# Patient Record
Sex: Female | Born: 1955 | Race: White | Hispanic: No | Smoking: Former smoker
Health system: Southern US, Community
[De-identification: ages and names within clinical notes are randomized; demographics above are authoritative.]

## PROBLEM LIST (undated history)

## (undated) DIAGNOSIS — K859 Acute pancreatitis without necrosis or infection, unspecified: Secondary | ICD-10-CM

## (undated) DIAGNOSIS — J45909 Unspecified asthma, uncomplicated: Secondary | ICD-10-CM

## (undated) DIAGNOSIS — I10 Essential (primary) hypertension: Secondary | ICD-10-CM

## (undated) DIAGNOSIS — C801 Malignant (primary) neoplasm, unspecified: Secondary | ICD-10-CM

## (undated) DIAGNOSIS — J449 Chronic obstructive pulmonary disease, unspecified: Secondary | ICD-10-CM

## (undated) DIAGNOSIS — E119 Type 2 diabetes mellitus without complications: Secondary | ICD-10-CM

## (undated) HISTORY — PX: HERNIA REPAIR: SHX51

## (undated) HISTORY — PX: ABDOMINAL HYSTERECTOMY: SHX81

## (undated) HISTORY — PX: CHOLECYSTECTOMY: SHX55

## (undated) HISTORY — PX: APPENDECTOMY: SHX54

## (undated) HISTORY — PX: FOOT SURGERY: SHX648

## (undated) HISTORY — PX: LUNG LOBECTOMY: SHX167

## (undated) HISTORY — PX: TUBAL LIGATION: SHX77

---

## 1999-04-08 ENCOUNTER — Encounter: Payer: Self-pay | Admitting: Family Medicine

## 1999-04-08 ENCOUNTER — Ambulatory Visit (HOSPITAL_COMMUNITY): Admission: RE | Admit: 1999-04-08 | Discharge: 1999-04-08 | Payer: Self-pay | Admitting: Family Medicine

## 1999-04-14 ENCOUNTER — Ambulatory Visit (HOSPITAL_COMMUNITY): Admission: RE | Admit: 1999-04-14 | Discharge: 1999-04-14 | Payer: Self-pay | Admitting: Family Medicine

## 1999-04-14 ENCOUNTER — Encounter: Payer: Self-pay | Admitting: Family Medicine

## 2003-05-12 ENCOUNTER — Ambulatory Visit (HOSPITAL_BASED_OUTPATIENT_CLINIC_OR_DEPARTMENT_OTHER): Admission: RE | Admit: 2003-05-12 | Discharge: 2003-05-12 | Payer: Self-pay | Admitting: Family Medicine

## 2003-06-18 ENCOUNTER — Ambulatory Visit (HOSPITAL_BASED_OUTPATIENT_CLINIC_OR_DEPARTMENT_OTHER): Admission: RE | Admit: 2003-06-18 | Discharge: 2003-06-18 | Payer: Self-pay | Admitting: Family Medicine

## 2004-01-07 ENCOUNTER — Ambulatory Visit (HOSPITAL_COMMUNITY): Admission: RE | Admit: 2004-01-07 | Discharge: 2004-01-07 | Payer: Self-pay | Admitting: Family Medicine

## 2004-04-22 ENCOUNTER — Ambulatory Visit (HOSPITAL_COMMUNITY): Admission: RE | Admit: 2004-04-22 | Discharge: 2004-04-22 | Payer: Self-pay | Admitting: Family Medicine

## 2005-03-24 ENCOUNTER — Encounter: Admission: RE | Admit: 2005-03-24 | Discharge: 2005-03-24 | Payer: Self-pay | Admitting: Family Medicine

## 2005-11-15 ENCOUNTER — Other Ambulatory Visit: Admission: RE | Admit: 2005-11-15 | Discharge: 2005-11-15 | Payer: Self-pay | Admitting: Family Medicine

## 2006-12-10 ENCOUNTER — Ambulatory Visit (HOSPITAL_COMMUNITY): Admission: RE | Admit: 2006-12-10 | Discharge: 2006-12-10 | Payer: Self-pay | Admitting: Family Medicine

## 2008-09-09 ENCOUNTER — Encounter: Payer: Self-pay | Admitting: Gastroenterology

## 2009-03-23 ENCOUNTER — Ambulatory Visit: Payer: Self-pay | Admitting: Gastroenterology

## 2009-03-23 DIAGNOSIS — R1033 Periumbilical pain: Secondary | ICD-10-CM | POA: Insufficient documentation

## 2009-03-30 ENCOUNTER — Ambulatory Visit: Payer: Self-pay | Admitting: Gastroenterology

## 2009-03-30 ENCOUNTER — Encounter: Payer: Self-pay | Admitting: Gastroenterology

## 2009-04-02 ENCOUNTER — Encounter: Payer: Self-pay | Admitting: Gastroenterology

## 2009-08-09 IMAGING — CR DG LUMBAR SPINE COMPLETE 4+V
1 series · 5 of 5 positions shown · non-contrast
Comparison: NONE

CLINICAL DATA: Low back and left leg pain. 

LUMBAR SPINE SERIES

[Series 1: view not recorded · 0.17mm/px · 5 of 5 slices shown]
[im 1/5]
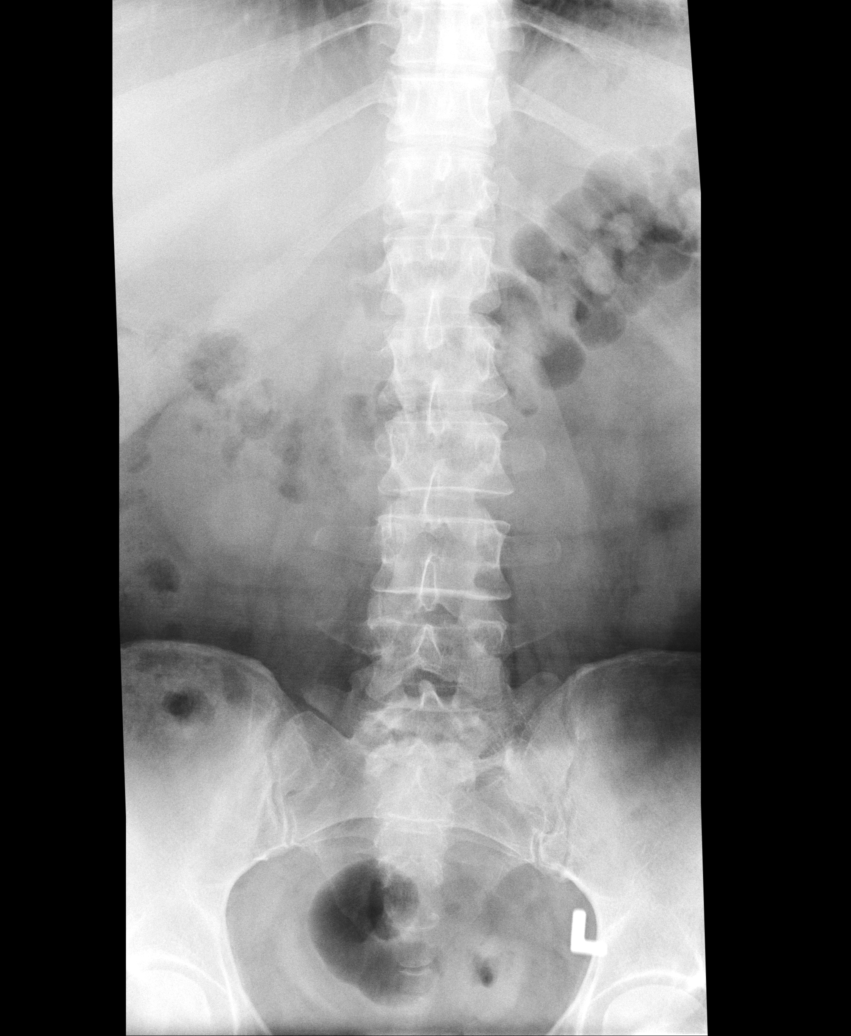
[im 2/5]
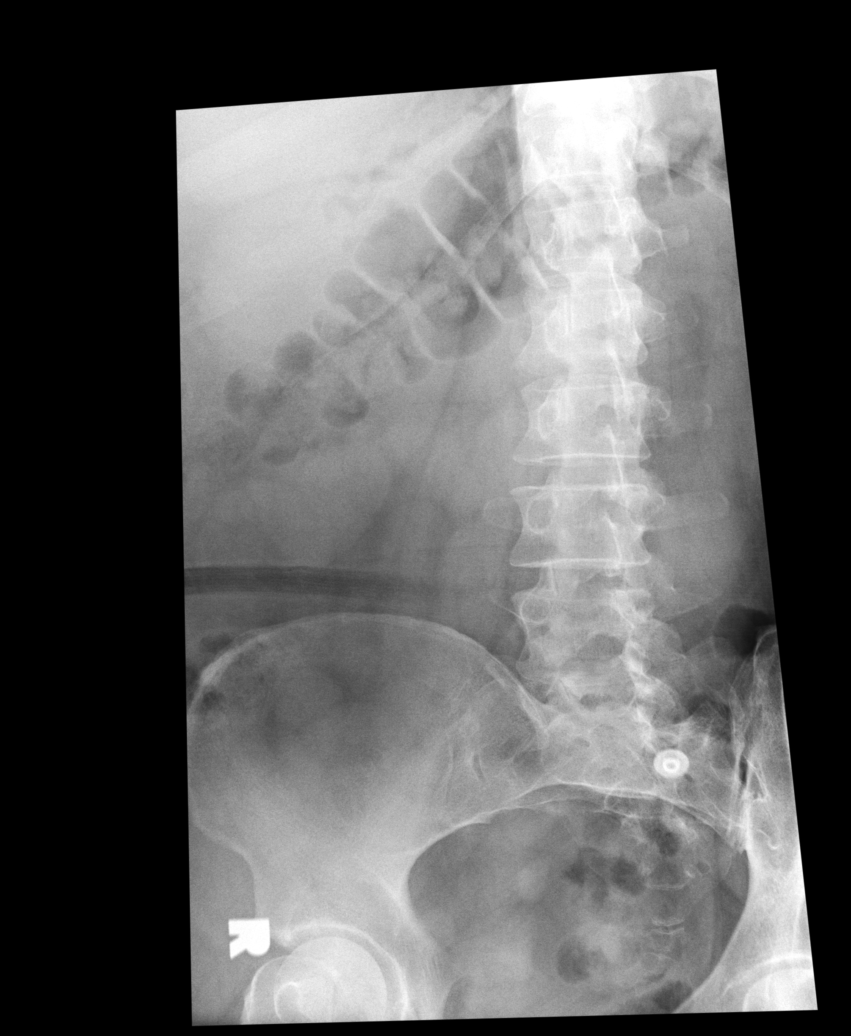
[im 3/5]
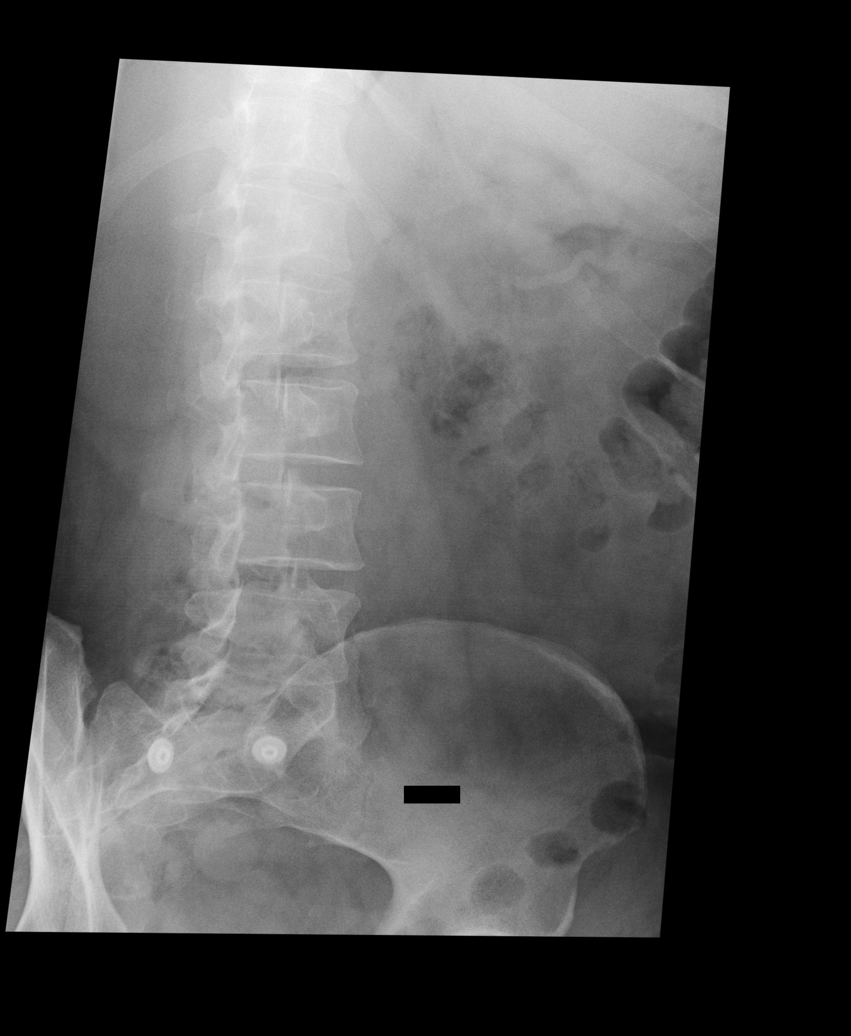
[im 4/5]
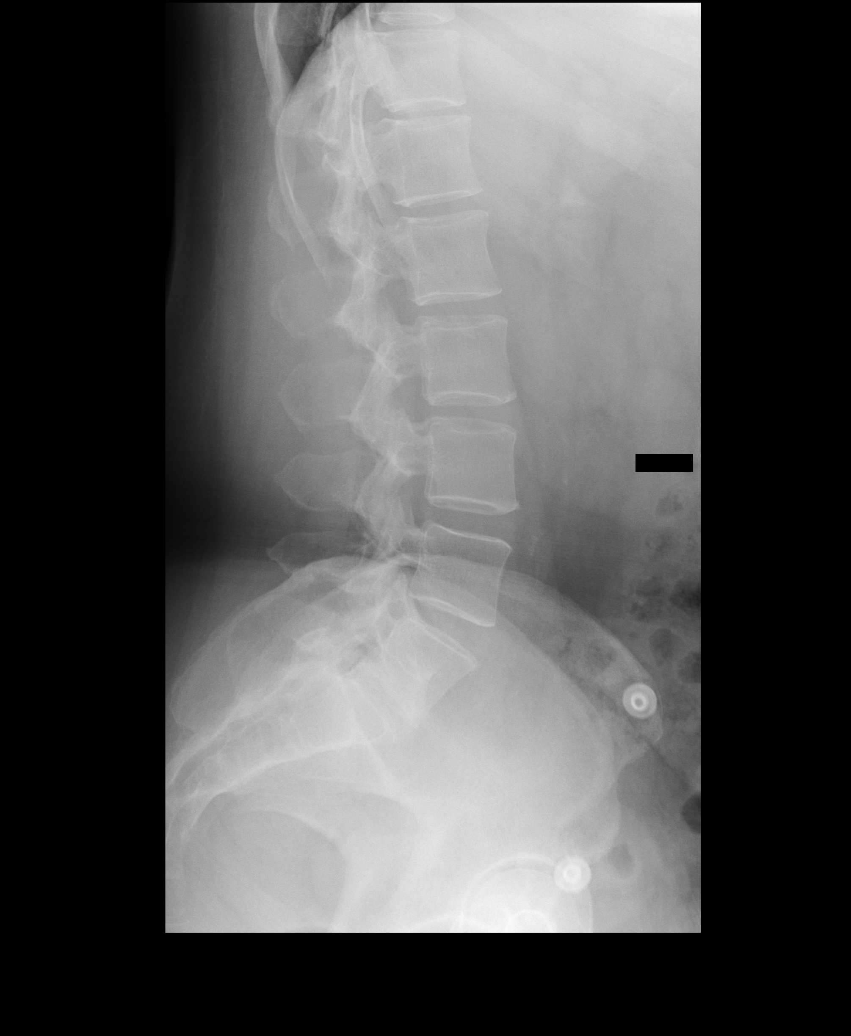
[im 5/5]
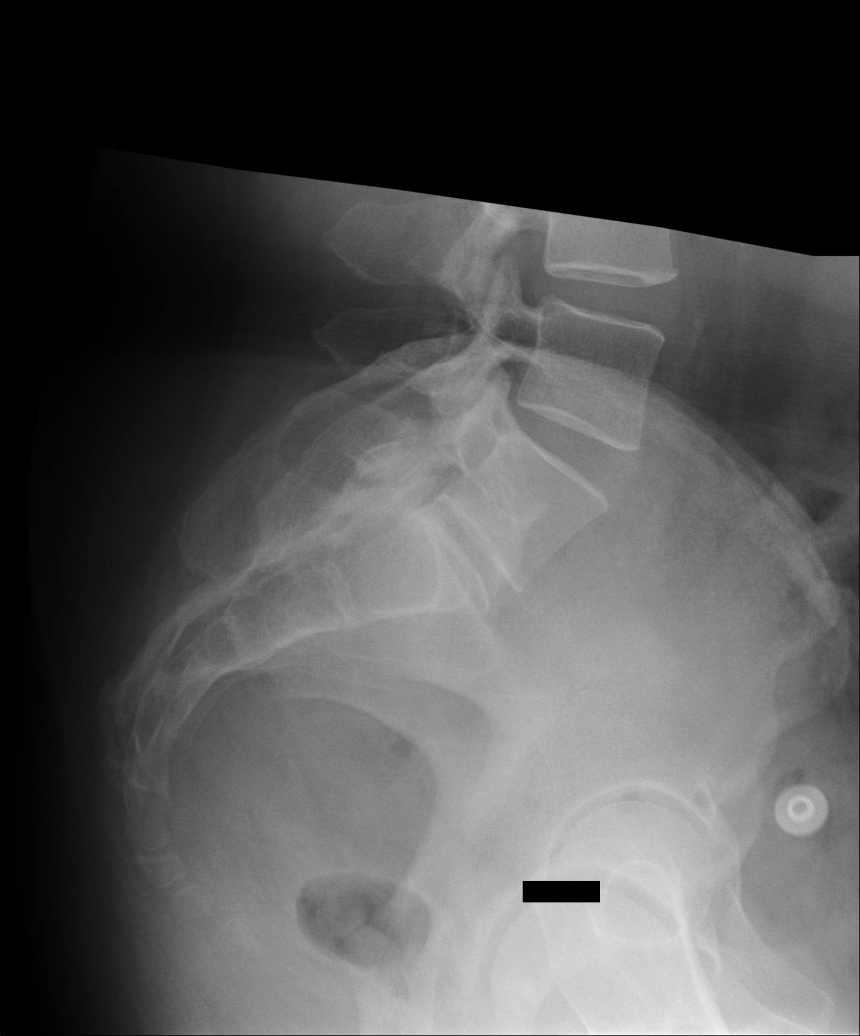

[5 of 5 positions shown; findings below may reference images not displayed]

FINDINGS: Routine views of the lumbar spine demonstrate vertebral 
bodies to be of normal stature, alignment, contour, and density.  
Disc spaces are well preserved.  Facet joints are normal.
IMPRESSION: Normal lumbar spine series. Note is made of a 
transitional S1 vertebral body with incomplete lumbarization. 
Date: 09/23/2008  Tran Date:  09/23/2008 DAS  [REDACTED]

## 2011-02-06 LAB — GLUCOSE, CAPILLARY: Glucose-Capillary: 100 mg/dL — ABNORMAL HIGH (ref 70–99)

## 2013-04-08 ENCOUNTER — Emergency Department (HOSPITAL_BASED_OUTPATIENT_CLINIC_OR_DEPARTMENT_OTHER): Payer: Self-pay

## 2013-04-08 ENCOUNTER — Emergency Department (HOSPITAL_BASED_OUTPATIENT_CLINIC_OR_DEPARTMENT_OTHER)
Admission: EM | Admit: 2013-04-08 | Discharge: 2013-04-09 | Disposition: A | Discharge status: Other Acute Inpatient Hospital | Payer: Self-pay | Attending: Emergency Medicine | Admitting: Emergency Medicine

## 2013-04-08 ENCOUNTER — Encounter (HOSPITAL_BASED_OUTPATIENT_CLINIC_OR_DEPARTMENT_OTHER): Payer: Self-pay | Admitting: *Deleted

## 2013-04-08 DIAGNOSIS — Z87891 Personal history of nicotine dependence: Secondary | ICD-10-CM | POA: Insufficient documentation

## 2013-04-08 DIAGNOSIS — R1110 Vomiting, unspecified: Secondary | ICD-10-CM | POA: Insufficient documentation

## 2013-04-08 DIAGNOSIS — E119 Type 2 diabetes mellitus without complications: Secondary | ICD-10-CM | POA: Insufficient documentation

## 2013-04-08 DIAGNOSIS — Z79899 Other long term (current) drug therapy: Secondary | ICD-10-CM | POA: Insufficient documentation

## 2013-04-08 DIAGNOSIS — R197 Diarrhea, unspecified: Secondary | ICD-10-CM | POA: Insufficient documentation

## 2013-04-08 DIAGNOSIS — I10 Essential (primary) hypertension: Secondary | ICD-10-CM | POA: Insufficient documentation

## 2013-04-08 DIAGNOSIS — K859 Acute pancreatitis without necrosis or infection, unspecified: Secondary | ICD-10-CM | POA: Insufficient documentation

## 2013-04-08 HISTORY — DX: Acute pancreatitis without necrosis or infection, unspecified: K85.90

## 2013-04-08 HISTORY — DX: Essential (primary) hypertension: I10

## 2013-04-08 HISTORY — DX: Type 2 diabetes mellitus without complications: E11.9

## 2013-04-08 LAB — URINALYSIS, ROUTINE W REFLEX MICROSCOPIC
Ketones, ur: 15 mg/dL — AB
Nitrite: NEGATIVE
Protein, ur: 30 mg/dL — AB
Urobilinogen, UA: 1 mg/dL (ref 0.0–1.0)

## 2013-04-08 LAB — COMPREHENSIVE METABOLIC PANEL
AST: 22 U/L (ref 0–37)
Albumin: 4.2 g/dL (ref 3.5–5.2)
Alkaline Phosphatase: 84 U/L (ref 39–117)
Calcium: 10.2 mg/dL (ref 8.4–10.5)
Chloride: 101 mEq/L (ref 96–112)
Creatinine, Ser: 0.9 mg/dL (ref 0.50–1.10)
GFR calc Af Amer: 81 mL/min — ABNORMAL LOW (ref >90)
GFR calc non Af Amer: 70 mL/min — ABNORMAL LOW (ref >90)
Total Protein: 8.1 g/dL (ref 6.0–8.3)

## 2013-04-08 LAB — CBC WITH DIFFERENTIAL/PLATELET
Basophils Absolute: 0 10*3/uL (ref 0.0–0.1)
Eosinophils Absolute: 0.3 10*3/uL (ref 0.0–0.7)
HCT: 42.8 % (ref 36.0–46.0)
Hemoglobin: 15.4 g/dL — ABNORMAL HIGH (ref 12.0–15.0)
Lymphs Abs: 2 10*3/uL (ref 0.7–4.0)
Platelets: 231 10*3/uL (ref 150–400)
WBC: 28.6 10*3/uL — ABNORMAL HIGH (ref 4.0–10.5)

## 2013-04-08 LAB — URINE MICROSCOPIC-ADD ON

## 2013-04-08 LAB — LIPASE, BLOOD: Lipase: 20 U/L (ref 11–59)

## 2013-04-08 MED ORDER — ONDANSETRON HCL 4 MG/2ML IJ SOLN
4.0000 mg | Freq: Once | INTRAMUSCULAR | Status: AC
  Administered 2013-04-08: 4 mg via INTRAVENOUS
  Filled 2013-04-08: qty 2

## 2013-04-08 MED ORDER — HYDROMORPHONE HCL PF 1 MG/ML IJ SOLN
1.0000 mg | Freq: Once | INTRAMUSCULAR | Status: AC
  Administered 2013-04-08: 1 mg via INTRAVENOUS
  Filled 2013-04-08: qty 1

## 2013-04-08 MED ORDER — SODIUM CHLORIDE 0.9 % IV BOLUS (SEPSIS)
1000.0000 mL | Freq: Once | INTRAVENOUS | Status: AC
  Administered 2013-04-08: 1000 mL via INTRAVENOUS

## 2013-04-08 MED ORDER — IOHEXOL 300 MG/ML  SOLN
100.0000 mL | Freq: Once | INTRAMUSCULAR | Status: AC | PRN
  Administered 2013-04-08: 100 mL via INTRAVENOUS

## 2013-04-08 MED ORDER — IOHEXOL 300 MG/ML  SOLN
50.0000 mL | Freq: Once | INTRAMUSCULAR | Status: AC | PRN
  Administered 2013-04-08: 50 mL via ORAL

## 2013-04-08 NOTE — ED Notes (Signed)
Patient transported to CT 

## 2013-04-08 NOTE — ED Notes (Signed)
Abdominal pain vomiting and diarrhea x 1 hour. States she has a hx of pancreatitis.

## 2013-04-08 NOTE — ED Provider Notes (Signed)
History     CSN: [id number]  Arrival date & time 04/08/13  2127   First MD Initiated Contact with Patient 04/08/13 2222      Chief Complaint  Patient presents with  . Abdominal Pain  . Emesis  . Diarrhea    (Consider location/radiation/quality/duration/timing/severity/associated sxs/prior treatment) Patient is a 57 y.o. female presenting with abdominal pain, vomiting, and diarrhea.  Abdominal Pain Associated symptoms include abdominal pain.  Emesis Associated symptoms: abdominal pain and diarrhea   Diarrhea Associated symptoms: abdominal pain and vomiting    Pt with history of pancreatitis with lengthy admission to [facility] several months ago reports she has not felt well since then, but over the last several days has been notably worse with diffuse severe abdominal pain, nausea vomiting and diarrhea this evening. No fever. Unable to tolerate PO fluids. Has not been back to GI for this exacerbation.  Past Medical History  Diagnosis Date  . Pancreatitis   . Diabetes mellitus without complication   . Hypertension     Past Surgical History  Procedure Laterality Date  . Abdominal hysterectomy    . Appendectomy    . Tubal ligation    . Foot surgery      No family history on file.  History  Substance Use Topics  . Smoking status: Former Games developer  . Smokeless tobacco: Not on file  . Alcohol Use: No    OB History   Grav Para Term Preterm Abortions TAB SAB Ect Mult Living                  Review of Systems  Gastrointestinal: Positive for vomiting, abdominal pain and diarrhea.   All other systems reviewed and are negative except as noted in HPI.   Allergies  Review of patient's allergies indicates no known allergies.  Home Medications   Current Outpatient Rx  Name  Route  Sig  Dispense  Refill  . LISINOPRIL PO   Oral   Take by mouth.         . METOPROLOL TARTRATE PO   Oral   Take by mouth.           BP 158/64  Pulse 61  Temp(Src) 97.8 F (36.6 C)  (Oral)  Resp 18  Ht 5\' 3"  (1.6 m)  Wt 190 lb (86.183 kg)  BMI 33.67 kg/m2  SpO2 97%  Physical Exam  Nursing note and vitals reviewed. Constitutional: She is oriented to person, place, and time. She appears well-developed and well-nourished. She appears distressed.  uncomfortable  HENT:  Head: Normocephalic and atraumatic.  Eyes: EOM are normal. Pupils are equal, round, and reactive to light.  Neck: Normal range of motion. Neck supple.  Cardiovascular: Normal rate, normal heart sounds and intact distal pulses.   Pulmonary/Chest: Effort normal and breath sounds normal.  Abdominal: Bowel sounds are normal. She exhibits no distension. There is tenderness (diffuse, worse in upper abd). There is no rebound and no guarding.  Musculoskeletal: Normal range of motion. She exhibits no edema and no tenderness.  Neurological: She is alert and oriented to person, place, and time. She has normal strength. No cranial nerve deficit or sensory deficit.  Skin: Skin is warm and dry. No rash noted.  Psychiatric: She has a normal mood and affect.    ED Course  Procedures (including critical care time)  Labs Reviewed  URINALYSIS, ROUTINE W REFLEX MICROSCOPIC - Abnormal; Notable for the following:    Color, Urine AMBER (*)    APPearance CLOUDY (*)  Specific Gravity, Urine 1.031 (*)    Bilirubin Urine SMALL (*)    Ketones, ur 15 (*)    Protein, ur 30 (*)    Leukocytes, UA SMALL (*)    All other components within normal limits  CBC WITH DIFFERENTIAL - Abnormal; Notable for the following:    WBC 28.6 (*)    Hemoglobin 15.4 (*)    Neutrophils Relative % 81 (*)    Lymphocytes Relative 7 (*)    Neutro Abs 23.2 (*)    Monocytes Absolute 3.1 (*)    All other components within normal limits  COMPREHENSIVE METABOLIC PANEL - Abnormal; Notable for the following:    Glucose, Bld 225 (*)    GFR calc non Af Amer 70 (*)    GFR calc Af Amer 81 (*)    All other components within normal limits  URINE  MICROSCOPIC-ADD ON - Abnormal; Notable for the following:    Squamous Epithelial / LPF MANY (*)    Bacteria, UA MANY (*)    Casts HYALINE CASTS (*)    All other components within normal limits  URINE CULTURE  LIPASE, BLOOD   No results found.   No diagnosis found.    MDM  Marked leukocytosis but normal LFT and Lipase. Pain improved with meds. Awaiting CT abd/pel. Care signed out to Dr. Nicanor Alcon on change of shift.         Rudell Marlowe B. Bernette Mayers, MD 04/08/13 [phone]

## 2013-04-09 MED ORDER — ONDANSETRON HCL 4 MG/2ML IJ SOLN
INTRAMUSCULAR | Status: AC
  Administered 2013-04-09: 01:00:00
  Filled 2013-04-09: qty 2

## 2013-04-09 MED ORDER — PIPERACILLIN-TAZOBACTAM 3.375 G IVPB 30 MIN
3.3750 g | Freq: Once | INTRAVENOUS | Status: AC
  Administered 2013-04-08: 3.375 g via INTRAVENOUS
  Filled 2013-04-09 (×2): qty 50

## 2013-04-09 MED ORDER — HYDROMORPHONE HCL PF 1 MG/ML IJ SOLN
INTRAMUSCULAR | Status: AC
  Administered 2013-04-09: 01:00:00
  Filled 2013-04-09: qty 1

## 2013-04-10 LAB — URINE CULTURE

## 2013-05-19 DIAGNOSIS — K219 Gastro-esophageal reflux disease without esophagitis: Secondary | ICD-10-CM | POA: Insufficient documentation

## 2013-05-19 DIAGNOSIS — E119 Type 2 diabetes mellitus without complications: Secondary | ICD-10-CM | POA: Insufficient documentation

## 2013-05-19 DIAGNOSIS — J449 Chronic obstructive pulmonary disease, unspecified: Secondary | ICD-10-CM | POA: Insufficient documentation

## 2013-05-19 DIAGNOSIS — E669 Obesity, unspecified: Secondary | ICD-10-CM | POA: Insufficient documentation

## 2013-05-19 DIAGNOSIS — I10 Essential (primary) hypertension: Secondary | ICD-10-CM | POA: Insufficient documentation

## 2013-05-19 DIAGNOSIS — J381 Polyp of vocal cord and larynx: Secondary | ICD-10-CM | POA: Insufficient documentation

## 2013-05-19 DIAGNOSIS — M545 Low back pain, unspecified: Secondary | ICD-10-CM | POA: Insufficient documentation

## 2014-02-28 ENCOUNTER — Encounter: Payer: Self-pay | Admitting: Gastroenterology

## 2014-03-13 ENCOUNTER — Encounter: Payer: Self-pay | Admitting: Gastroenterology

## 2014-10-28 ENCOUNTER — Emergency Department (HOSPITAL_BASED_OUTPATIENT_CLINIC_OR_DEPARTMENT_OTHER): Payer: Self-pay

## 2014-10-28 ENCOUNTER — Encounter (HOSPITAL_BASED_OUTPATIENT_CLINIC_OR_DEPARTMENT_OTHER): Payer: Self-pay

## 2014-10-28 ENCOUNTER — Emergency Department (HOSPITAL_BASED_OUTPATIENT_CLINIC_OR_DEPARTMENT_OTHER)
Admission: EM | Admit: 2014-10-28 | Discharge: 2014-10-28 | Disposition: A | Discharge status: Other Acute Inpatient Hospital | Payer: Self-pay | Attending: Emergency Medicine | Admitting: Emergency Medicine

## 2014-10-28 DIAGNOSIS — K529 Noninfective gastroenteritis and colitis, unspecified: Secondary | ICD-10-CM | POA: Insufficient documentation

## 2014-10-28 DIAGNOSIS — R1013 Epigastric pain: Secondary | ICD-10-CM | POA: Insufficient documentation

## 2014-10-28 DIAGNOSIS — R630 Anorexia: Secondary | ICD-10-CM | POA: Insufficient documentation

## 2014-10-28 DIAGNOSIS — R197 Diarrhea, unspecified: Secondary | ICD-10-CM

## 2014-10-28 DIAGNOSIS — E119 Type 2 diabetes mellitus without complications: Secondary | ICD-10-CM | POA: Insufficient documentation

## 2014-10-28 DIAGNOSIS — R1012 Left upper quadrant pain: Secondary | ICD-10-CM

## 2014-10-28 LAB — COMPREHENSIVE METABOLIC PANEL
ALBUMIN: 4.1 g/dL (ref 3.5–5.2)
ALK PHOS: 101 U/L (ref 39–117)
ALT: 38 U/L — ABNORMAL HIGH (ref 0–35)
ANION GAP: 10 (ref 5–15)
AST: 34 U/L (ref 0–37)
BILIRUBIN TOTAL: 0.8 mg/dL (ref 0.3–1.2)
BUN: 8 mg/dL (ref 6–23)
CALCIUM: 9.4 mg/dL (ref 8.4–10.5)
CO2: 20 mmol/L (ref 19–32)
CREATININE: 0.55 mg/dL (ref 0.50–1.10)
Chloride: 103 mEq/L (ref 96–112)
GFR calc Af Amer: >90 mL/min (ref >90)
GLUCOSE: 276 mg/dL — AB (ref 70–99)
Potassium: 4 mmol/L (ref 3.5–5.1)
Sodium: 133 mmol/L — ABNORMAL LOW (ref 135–145)
Total Protein: 7.9 g/dL (ref 6.0–8.3)

## 2014-10-28 LAB — CBC WITH DIFFERENTIAL/PLATELET
BASOS ABS: 0.1 10*3/uL (ref 0.0–0.1)
BASOS PCT: 1 % (ref 0–1)
Eosinophils Absolute: 0.2 10*3/uL (ref 0.0–0.7)
Eosinophils Relative: 2 % (ref 0–5)
HCT: 44.4 % (ref 36.0–46.0)
Hemoglobin: 15.7 g/dL — ABNORMAL HIGH (ref 12.0–15.0)
Lymphocytes Relative: 16 % (ref 12–46)
Lymphs Abs: 1.1 10*3/uL (ref 0.7–4.0)
MCH: 32.5 pg (ref 26.0–34.0)
MCHC: 35.4 g/dL (ref 30.0–36.0)
MCV: 91.9 fL (ref 78.0–100.0)
MONO ABS: 1.2 10*3/uL — AB (ref 0.1–1.0)
Monocytes Relative: 18 % — ABNORMAL HIGH (ref 3–12)
NEUTROS PCT: 63 % (ref 43–77)
Neutro Abs: 4.2 10*3/uL (ref 1.7–7.7)
Platelets: 129 10*3/uL — ABNORMAL LOW (ref 150–400)
RBC: 4.83 MIL/uL (ref 3.87–5.11)
RDW: 12.7 % (ref 11.5–15.5)
WBC: 6.7 10*3/uL (ref 4.0–10.5)

## 2014-10-28 LAB — LIPASE, BLOOD: Lipase: 20 U/L (ref 11–59)

## 2014-10-28 MED ORDER — METRONIDAZOLE IN NACL 5-0.79 MG/ML-% IV SOLN
500.0000 mg | Freq: Once | INTRAVENOUS | Status: AC
Start: 2014-10-28 — End: 2014-10-28
  Administered 2014-10-28: 500 mg via INTRAVENOUS
  Filled 2014-10-28: qty 100

## 2014-10-28 MED ORDER — SODIUM CHLORIDE 0.9 % IV BOLUS (SEPSIS)
1000.0000 mL | Freq: Once | INTRAVENOUS | Status: AC
  Administered 2014-10-28: 1000 mL via INTRAVENOUS

## 2014-10-28 MED ORDER — IOHEXOL 300 MG/ML  SOLN
50.0000 mL | Freq: Once | INTRAMUSCULAR | Status: AC | PRN
  Administered 2014-10-28: 50 mL via ORAL

## 2014-10-28 MED ORDER — HYDROMORPHONE HCL 1 MG/ML IJ SOLN
0.5000 mg | Freq: Once | INTRAMUSCULAR | Status: AC
  Administered 2014-10-28: 0.5 mg via INTRAVENOUS
  Filled 2014-10-28: qty 1

## 2014-10-28 MED ORDER — ONDANSETRON HCL 4 MG/2ML IJ SOLN
4.0000 mg | Freq: Once | INTRAMUSCULAR | Status: AC
Start: 2014-10-28 — End: 2014-10-28
  Administered 2014-10-28: 4 mg via INTRAVENOUS
  Filled 2014-10-28: qty 2

## 2014-10-28 MED ORDER — ONDANSETRON HCL 4 MG/2ML IJ SOLN
4.0000 mg | Freq: Once | INTRAMUSCULAR | Status: AC
  Administered 2014-10-28: 4 mg via INTRAVENOUS
  Filled 2014-10-28: qty 2

## 2014-10-28 MED ORDER — IOHEXOL 300 MG/ML  SOLN
100.0000 mL | Freq: Once | INTRAMUSCULAR | Status: AC | PRN
  Administered 2014-10-28: 100 mL via INTRAVENOUS

## 2014-10-28 MED ORDER — CIPROFLOXACIN IN D5W 400 MG/200ML IV SOLN
400.0000 mg | Freq: Once | INTRAVENOUS | Status: AC
  Administered 2014-10-28: 400 mg via INTRAVENOUS
  Filled 2014-10-28: qty 200

## 2014-10-28 MED ORDER — HYDROMORPHONE HCL 1 MG/ML IJ SOLN
1.0000 mg | Freq: Once | INTRAMUSCULAR | Status: AC
  Administered 2014-10-28: 1 mg via INTRAVENOUS
  Filled 2014-10-28: qty 1

## 2014-10-28 MED ORDER — DIPHENHYDRAMINE HCL 50 MG/ML IJ SOLN
25.0000 mg | Freq: Once | INTRAMUSCULAR | Status: AC
  Administered 2014-10-28: 25 mg via INTRAVENOUS
  Filled 2014-10-28: qty 1

## 2014-10-28 MED ORDER — PROMETHAZINE HCL 25 MG PO TABS: 25.0000 mg | ORAL_TABLET | Freq: Four times a day (QID) | ORAL | Status: DC | PRN

## 2014-10-28 MED ORDER — HYDROMORPHONE HCL 1 MG/ML IJ SOLN
INTRAMUSCULAR | Status: AC
  Filled 2014-10-28: qty 1

## 2014-10-28 NOTE — ED Notes (Signed)
MD at bedside. 

## 2014-10-28 NOTE — ED Notes (Signed)
C/o abd pain, diarrhea x 1-2 weeks

## 2014-10-28 NOTE — ED Notes (Signed)
Bed assignment Room 636 received from [facility]  [facility] informed

## 2014-10-28 NOTE — ED Provider Notes (Signed)
CSN: [id number]     Arrival date & time 10/28/14  1223 History   First MD Initiated Contact with Patient 10/28/14 1244     Chief Complaint  Patient presents with  . Abdominal Pain   Haley Henderson is a 58 y.o. female with a history of pancreatitis, diabetes and hypertension who presents to the emergency department complaining of nausea, watery diarrhea, epigastric, and left upper quadrant abdominal pain for the past week. Patient reports her pain is 8 out of 10 and constant. Patient reports her pain starts midepigastric and radiates to her left upper quadrant and radiates to her back. Patient does report this feels similar to her pancreatitis previously but not as bad pain. Patient reports lots of nausea without vomiting. The patient reports multiple watery stools for the past week. Patient denies rice water stools. The patient has attempted using heat, and Phenergan last night without relief. The patient reports she has run out of money to buy her insulin. She denies recent antibiotic use. The patient denies fevers, sick contacts, suspicious food intake, vomiting, rice water stools, hematemesis, hematochezia, dysuria, hematuria, urinary frequency, urinary urgency, rashes or lesions.  (Consider location/radiation/quality/duration/timing/severity/associated sxs/prior Treatment) HPI  Past Medical History  Diagnosis Date  . Pancreatitis   . Diabetes mellitus without complication   . Hypertension    Past Surgical History  Procedure Laterality Date  . Abdominal hysterectomy    . Appendectomy    . Tubal ligation    . Foot surgery    . Cholecystectomy     No family history on file. History  Substance Use Topics  . Smoking status: Former Research scientist (life sciences)  . Smokeless tobacco: Not on file  . Alcohol Use: No   OB History    No data available     Review of Systems  Constitutional: Positive for chills and appetite change. Negative for fever.  HENT: Negative for congestion, ear pain, sore  throat and trouble swallowing.   Eyes: Negative for pain and visual disturbance.  Respiratory: Negative for cough, shortness of breath and wheezing.   Cardiovascular: Negative for chest pain, palpitations and leg swelling.  Gastrointestinal: Positive for nausea, abdominal pain and diarrhea. Negative for vomiting, constipation, blood in stool and abdominal distention.  Genitourinary: Negative for dysuria, urgency, frequency, hematuria, decreased urine volume and difficulty urinating.  Musculoskeletal: Negative for back pain and neck pain.  Skin: Negative for rash and wound.  Neurological: Negative for dizziness, weakness, light-headedness and headaches.  All other systems reviewed and are negative.     Allergies  Morphine and related  Home Medications   Prior to Admission medications   Medication Sig Start Date End Date Taking? Authorizing Provider  Esomeprazole Magnesium (NEXIUM PO) Take by mouth.   Yes Historical Provider, MD  Insulin Zinc Human (NOVOLIN L [place]) Inject into the skin.   Yes Historical Provider, MD  lipase/protease/amylase (CREON) 12000 UNITS CPEP capsule Take by mouth.   Yes Historical Provider, MD  LOSARTAN POTASSIUM PO Take by mouth.   Yes Historical Provider, MD  METOPROLOL TARTRATE PO Take by mouth.    Historical Provider, MD   BP 147/91 mmHg  Pulse 87  Temp(Src) 99.2 F (37.3 C) (Oral)  Resp 18  Ht 5\' 3"  (1.6 m)  Wt 180 lb (81.647 kg)  BMI 31.89 kg/m2  SpO2 95% Physical Exam  Constitutional: She appears well-developed and well-nourished. No distress.  Patient appears uncomfortable.   HENT:  Head: Normocephalic and atraumatic.  Right Ear: External ear normal.  Left  Ear: External ear normal.  Nose: Nose normal.  Mouth/Throat: Oropharynx is clear and moist. No oropharyngeal exudate.  Eyes: Conjunctivae are normal. Pupils are equal, round, and reactive to light. Right eye exhibits no discharge. Left eye exhibits no discharge.  Neck: Neck supple.   Cardiovascular: Normal rate, regular rhythm, normal heart sounds and intact distal pulses.  Exam reveals no gallop and no friction rub.   No murmur heard. Pulmonary/Chest: Effort normal and breath sounds normal. No respiratory distress. She has no wheezes. She has no rales.  Abdominal: Soft. Bowel sounds are normal. She exhibits no distension and no mass. There is tenderness. There is no rebound and no guarding.  Epigastric and left upper quadrant tenderness to palpation. Abdomen is soft. Bowel sounds are present. No right lower quadrant tenderness. Negative psoas and obturator sign.  Musculoskeletal: She exhibits no edema.  Lymphadenopathy:    She has no cervical adenopathy.  Neurological: She is alert. Coordination normal.  Skin: Skin is warm and dry. No rash noted. She is not diaphoretic. No erythema. No pallor.  Psychiatric: She has a normal mood and affect. Her behavior is normal.  Nursing note and vitals reviewed.   ED Course  Procedures (including critical care time) Labs Review Labs Reviewed  COMPREHENSIVE METABOLIC PANEL - Abnormal; Notable for the following:    Sodium 133 (*)    Glucose, Bld 276 (*)    ALT 38 (*)    All other components within normal limits  CBC WITH DIFFERENTIAL - Abnormal; Notable for the following:    Hemoglobin 15.7 (*)    Platelets 129 (*)    Monocytes Relative 18 (*)    Monocytes Absolute 1.2 (*)    All other components within normal limits  STOOL CULTURE  CLOSTRIDIUM DIFFICILE BY PCR  LIPASE, BLOOD  URINALYSIS, ROUTINE W REFLEX MICROSCOPIC    Imaging Review Ct Abdomen Pelvis W Contrast  10/28/2014   CLINICAL DATA:  58 year old female with epigastric and left upper quadrant pain, nausea and diarrhea for the past 1-2 weeks. Prior history of pancreatitis.  EXAM: CT ABDOMEN AND PELVIS WITH CONTRAST  TECHNIQUE: Multidetector CT imaging of the abdomen and pelvis was performed using the standard protocol following bolus administration of intravenous  contrast.  CONTRAST:  60mL OMNIPAQUE IOHEXOL 300 MG/ML SOLN, 182mL OMNIPAQUE IOHEXOL 300 MG/ML SOLN  COMPARISON:  CT of the abdomen and pelvis 10/28/2014.  FINDINGS: Lower chest:  Unremarkable.  Hepatobiliary: Heterogeneous decreased attenuation throughout the hepatic parenchyma, compatible with heterogeneous hepatic steatosis. No discrete cystic or solid hepatic lesion. No intra or extrahepatic biliary ductal dilatation. Status post cholecystectomy.  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 No new pancreatic lesions are noted. No pancreatic ductal dilatation.  Spleen: Mild splenomegaly again noted, with the spleen measuring approximately 16.4 x 6.0 x 12.1 cm (estimated splenic volume of 595 mL). Prominent clefts in the posterior aspect of the spleen are unchanged.  Adrenals/Urinary Tract: Sub cm low-attenuation lesion in the upper pole of the left kidney is too small to characterize, but similar to prior examinations, favored to represent a tiny cyst. 2.3 cm low-attenuation lesion in the upper pole of the right kidney is compatible with a simple cyst. No other aggressive appearing renal lesions are noted. No hydroureteronephrosis. Urinary bladder is normal in appearance. Normal appearance  of the adrenal glands bilaterally.  Stomach/Bowel: The appearance of the stomach is normal. No pathologic dilatation of small bowel or colon. Extensive colonic wall thickening, most evident in the transverse colon, suggestive of a colitis. Some rectal wall thickening is also noted. Mild haziness in the surrounding mesorectum and mesocolon suggesting inflammation.  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  Superior mesenteric vein and portal vein are patent. There continues to be some mild narrowing near the splenoportal confluence which is unchanged. Atherosclerosis throughout the abdominal and pelvic vasculature, without evidence of aneurysm or dissection. Numerous borderline enlarged retroperitoneal lymph nodes are noted, which are nonspecific and similar to the prior examination. Likewise there are numerous borderline enlarged mesenteric lymph nodes which are also similar to the prior examination, presumably chronic and reactive.  Reproductive: Status post hysterectomy. Ovaries are not confidently identified may be surgically absent or atrophic.  Other: Trace volume of ascites.  No pneumoperitoneum.  Musculoskeletal: There are no aggressive appearing lytic or blastic lesions noted in the visualized portions of the skeleton.  IMPRESSION: 1. Patchy thickening of the colon and rectum, with surrounding inflammatory changes in the mesorectum and mesocolon, suggestive of a proctocolitis. 2. 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 Persistent splenomegaly. 3. 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 4. Heterogeneous hepatic steatosis again noted. 5. Status post cholecystectomy. 6. Atherosclerosis. 7. Additional incidental findings, as above, similar to the prior examination.   Electronically Signed   By: Vinnie Langton M.D.   On: 10/28/2014 15:05     EKG Interpretation None      Filed Vitals:   10/28/14 1231 10/28/14 1520  BP: 171/100 147/91  Pulse: 98 87  Temp: 99.5 F (37.5 C) 99.2 F (37.3 C)  TempSrc: Oral Oral  Resp: 20 18  Height: 5\' 3"  (1.6 m)   Weight: 180 lb (81.647 kg)   SpO2: 97% 95%     MDM   Meds given in ED:  Medications  metroNIDAZOLE (FLAGYL) IVPB 500 mg (500 mg Intravenous New Bag/Given 10/28/14 1545)  ciprofloxacin (CIPRO) IVPB 400 mg (not administered)  sodium chloride 0.9 % bolus 1,000 mL  (0 mLs Intravenous Stopped 10/28/14 1420)  ondansetron (ZOFRAN) injection 4 mg (4 mg Intravenous Given 10/28/14 1323)  HYDROmorphone (DILAUDID) injection 0.5 mg (0.5 mg Intravenous Given 10/28/14 1323)  HYDROmorphone (DILAUDID) injection 1 mg (1 mg Intravenous Given 10/28/14 1356)  iohexol (OMNIPAQUE) 300 MG/ML solution 50 mL (50 mLs Oral Contrast Given 10/28/14 1354)  iohexol (OMNIPAQUE) 300 MG/ML solution 100 mL (100 mLs Intravenous Contrast Given 10/28/14 1439)  ondansetron (ZOFRAN) injection 4 mg (4 mg Intravenous Given 10/28/14 1537)  diphenhydrAMINE (BENADRYL) injection 25 mg (25 mg Intravenous Given 10/28/14 1606)    New Prescriptions   No medications on file    Final diagnoses:  Epigastric pain  Diarrhea  Colitis   Patient with history of diabetes, hypertension and pancreatitis presented to the emergency department with 1 week of diarrhea, nausea and abdominal pain that she reports feels similar to her previous pancreatitis. Patient does have left upper quadrant and epigastric tenderness to palpation. The patient has no peritoneal signs. The patient appears very uncomfortable and in pain. The patient's laboratory work is unremarkable including a normal lipase. Patient's CT scan here today shows proctocolitis without signs of pancreatitis. The patient has continued to be very uncomfortable and her pain has been not well controlled in the emergency  department. This patient is started on IV Cipro and Flagyl. The patient has pending stool cultures and C. difficile culture. I believe this patient would benefit from inpatient stay. The patient is requesting to be transferred to [facility] for admission was done with Dr. Valeda Malm who accepts her for admission at [facility] hospital's medical bed. This patient will be transferred to [facility].  This patient was discussed with and evaluated by Dr. Leonides Schanz who agrees with assessment and  plan.     Hanley Hays, PA-C 10/28/14 [phone]

## 2014-10-28 NOTE — ED Notes (Signed)
Pt c/o itching all over and is scratching nose-states hx itching over 1 year-no rash/hives noted

## 2014-10-28 NOTE — ED Notes (Signed)
Attempted to call report to [facility]-nurse unavailable and will call back-[facility] and [facility] have been called for transport-no transport available at this time

## 2014-10-28 NOTE — ED Provider Notes (Signed)
Medical screening examination/treatment/procedure(s) were conducted as a shared visit with non-physician practitioner(s) and myself.  I personally evaluated the patient during the encounter.   EKG Interpretation None      Pt is a 58 y.o. female with prior history of pancreatitis, hypertension and diabetes who presents emergency department with diarrhea, nausea and abdominal pain that feels similar to her prior episodes of pancreatitis. On exam she has left upper quadrant and epigastric tenderness without guarding or rebound, no peritoneal signs but she does appear very uncomfortable. Patient has had a CT scan in July 2014 which showed pancreatitis without signs of necrosis or pseudocyst. Today her labs are unremarkable including a normal lipase.  CT scan shows proctocolitis and persistent, stable splenomegaly. She does have pancreatic pseudocyst that is decreased in size compared to prior CT scan. No sign of pancreatitis.  Stool cultures, C. difficile PCR pending. Patient's pain and nausea has not been well-controlled in the ER. Will admit. Patient is requesting transfer to [facility] Discussed with Dr. Valeda Malm for admission to a medical bed.   [place], DO 10/28/14 1556

## 2014-10-28 NOTE — ED Notes (Signed)
Pt gave scant amount of urine

## 2014-10-28 NOTE — ED Notes (Signed)
Pt was unable to provide urine or stool sample while in ED

## 2015-02-11 ENCOUNTER — Encounter: Payer: Self-pay | Admitting: Gastroenterology

## 2015-04-26 ENCOUNTER — Encounter: Payer: Self-pay | Admitting: Gastroenterology

## 2015-07-22 DIAGNOSIS — K2990 Gastroduodenitis, unspecified, without bleeding: Secondary | ICD-10-CM | POA: Insufficient documentation

## 2015-08-18 DIAGNOSIS — F418 Other specified anxiety disorders: Secondary | ICD-10-CM | POA: Insufficient documentation

## 2015-08-25 DIAGNOSIS — Z5309 Procedure and treatment not carried out because of other contraindication: Secondary | ICD-10-CM | POA: Insufficient documentation

## 2015-11-24 DIAGNOSIS — M25512 Pain in left shoulder: Secondary | ICD-10-CM | POA: Insufficient documentation

## 2015-11-24 DIAGNOSIS — G8929 Other chronic pain: Secondary | ICD-10-CM | POA: Insufficient documentation

## 2015-12-18 DIAGNOSIS — K863 Pseudocyst of pancreas: Secondary | ICD-10-CM | POA: Insufficient documentation

## 2016-01-19 DIAGNOSIS — I8500 Esophageal varices without bleeding: Secondary | ICD-10-CM | POA: Insufficient documentation

## 2016-01-19 DIAGNOSIS — K7581 Nonalcoholic steatohepatitis (NASH): Secondary | ICD-10-CM | POA: Insufficient documentation

## 2016-01-19 DIAGNOSIS — K7460 Unspecified cirrhosis of liver: Secondary | ICD-10-CM | POA: Insufficient documentation

## 2016-04-19 DIAGNOSIS — G43909 Migraine, unspecified, not intractable, without status migrainosus: Secondary | ICD-10-CM | POA: Insufficient documentation

## 2016-04-19 DIAGNOSIS — K861 Other chronic pancreatitis: Secondary | ICD-10-CM | POA: Insufficient documentation

## 2016-04-19 DIAGNOSIS — R161 Splenomegaly, not elsewhere classified: Secondary | ICD-10-CM | POA: Insufficient documentation

## 2016-05-02 DIAGNOSIS — R188 Other ascites: Secondary | ICD-10-CM | POA: Insufficient documentation

## 2016-05-02 DIAGNOSIS — R072 Precordial pain: Secondary | ICD-10-CM | POA: Insufficient documentation

## 2016-05-05 DIAGNOSIS — K529 Noninfective gastroenteritis and colitis, unspecified: Secondary | ICD-10-CM | POA: Insufficient documentation

## 2016-05-15 DIAGNOSIS — R531 Weakness: Secondary | ICD-10-CM | POA: Insufficient documentation

## 2016-05-15 DIAGNOSIS — K766 Portal hypertension: Secondary | ICD-10-CM | POA: Insufficient documentation

## 2016-05-15 DIAGNOSIS — G894 Chronic pain syndrome: Secondary | ICD-10-CM | POA: Insufficient documentation

## 2016-05-15 DIAGNOSIS — K589 Irritable bowel syndrome without diarrhea: Secondary | ICD-10-CM | POA: Insufficient documentation

## 2016-05-16 DIAGNOSIS — D696 Thrombocytopenia, unspecified: Secondary | ICD-10-CM | POA: Insufficient documentation

## 2016-07-28 DIAGNOSIS — Z860100 Personal history of colon polyps, unspecified: Secondary | ICD-10-CM | POA: Insufficient documentation

## 2016-07-28 DIAGNOSIS — K862 Cyst of pancreas: Secondary | ICD-10-CM | POA: Insufficient documentation

## 2016-10-06 DIAGNOSIS — K117 Disturbances of salivary secretion: Secondary | ICD-10-CM | POA: Insufficient documentation

## 2016-10-06 DIAGNOSIS — R011 Cardiac murmur, unspecified: Secondary | ICD-10-CM | POA: Insufficient documentation

## 2017-07-14 DIAGNOSIS — E1169 Type 2 diabetes mellitus with other specified complication: Secondary | ICD-10-CM | POA: Insufficient documentation

## 2017-07-14 DIAGNOSIS — E785 Hyperlipidemia, unspecified: Secondary | ICD-10-CM | POA: Insufficient documentation

## 2017-08-16 DIAGNOSIS — R42 Dizziness and giddiness: Secondary | ICD-10-CM | POA: Insufficient documentation

## 2018-04-05 DIAGNOSIS — J4540 Moderate persistent asthma, uncomplicated: Secondary | ICD-10-CM | POA: Insufficient documentation

## 2018-04-05 DIAGNOSIS — G253 Myoclonus: Secondary | ICD-10-CM | POA: Insufficient documentation

## 2018-04-05 DIAGNOSIS — G250 Essential tremor: Secondary | ICD-10-CM | POA: Insufficient documentation

## 2018-06-26 DIAGNOSIS — R413 Other amnesia: Secondary | ICD-10-CM | POA: Insufficient documentation

## 2019-08-22 DIAGNOSIS — R0609 Other forms of dyspnea: Secondary | ICD-10-CM | POA: Insufficient documentation

## 2019-08-22 DIAGNOSIS — R0600 Dyspnea, unspecified: Secondary | ICD-10-CM | POA: Insufficient documentation

## 2019-12-08 DIAGNOSIS — I2510 Atherosclerotic heart disease of native coronary artery without angina pectoris: Secondary | ICD-10-CM | POA: Insufficient documentation

## 2020-03-12 DIAGNOSIS — I480 Paroxysmal atrial fibrillation: Secondary | ICD-10-CM | POA: Insufficient documentation

## 2020-03-12 DIAGNOSIS — I421 Obstructive hypertrophic cardiomyopathy: Secondary | ICD-10-CM | POA: Insufficient documentation

## 2020-03-12 DIAGNOSIS — Z8719 Personal history of other diseases of the digestive system: Secondary | ICD-10-CM | POA: Insufficient documentation

## 2020-03-23 DIAGNOSIS — F40240 Claustrophobia: Secondary | ICD-10-CM | POA: Insufficient documentation

## 2020-04-02 DIAGNOSIS — Z85118 Personal history of other malignant neoplasm of bronchus and lung: Secondary | ICD-10-CM | POA: Insufficient documentation

## 2020-06-02 DIAGNOSIS — C3431 Malignant neoplasm of lower lobe, right bronchus or lung: Secondary | ICD-10-CM | POA: Insufficient documentation

## 2020-07-29 DIAGNOSIS — E663 Overweight: Secondary | ICD-10-CM | POA: Insufficient documentation

## 2021-01-01 ENCOUNTER — Encounter (HOSPITAL_BASED_OUTPATIENT_CLINIC_OR_DEPARTMENT_OTHER): Payer: Self-pay | Admitting: *Deleted

## 2021-01-01 ENCOUNTER — Emergency Department (HOSPITAL_BASED_OUTPATIENT_CLINIC_OR_DEPARTMENT_OTHER)
Admission: EM | Admit: 2021-01-01 | Discharge: 2021-01-01 | Disposition: A | Discharge status: Home / Self Care | Payer: Medicaid Other | Attending: Emergency Medicine | Admitting: Emergency Medicine

## 2021-01-01 ENCOUNTER — Emergency Department (HOSPITAL_BASED_OUTPATIENT_CLINIC_OR_DEPARTMENT_OTHER): Payer: Medicaid Other

## 2021-01-01 ENCOUNTER — Other Ambulatory Visit: Payer: Self-pay

## 2021-01-01 DIAGNOSIS — I10 Essential (primary) hypertension: Secondary | ICD-10-CM | POA: Diagnosis not present

## 2021-01-01 DIAGNOSIS — Z87891 Personal history of nicotine dependence: Secondary | ICD-10-CM | POA: Diagnosis not present

## 2021-01-01 DIAGNOSIS — J449 Chronic obstructive pulmonary disease, unspecified: Secondary | ICD-10-CM | POA: Diagnosis not present

## 2021-01-01 DIAGNOSIS — R140 Abdominal distension (gaseous): Secondary | ICD-10-CM | POA: Diagnosis present

## 2021-01-01 DIAGNOSIS — R188 Other ascites: Secondary | ICD-10-CM | POA: Insufficient documentation

## 2021-01-01 DIAGNOSIS — Z794 Long term (current) use of insulin: Secondary | ICD-10-CM | POA: Insufficient documentation

## 2021-01-01 DIAGNOSIS — E119 Type 2 diabetes mellitus without complications: Secondary | ICD-10-CM | POA: Diagnosis not present

## 2021-01-01 DIAGNOSIS — Z859 Personal history of malignant neoplasm, unspecified: Secondary | ICD-10-CM | POA: Insufficient documentation

## 2021-01-01 HISTORY — DX: Malignant (primary) neoplasm, unspecified: C80.1

## 2021-01-01 HISTORY — DX: Unspecified asthma, uncomplicated: J45.909

## 2021-01-01 HISTORY — DX: Chronic obstructive pulmonary disease, unspecified: J44.9

## 2021-01-01 LAB — URINALYSIS, ROUTINE W REFLEX MICROSCOPIC
Bilirubin Urine: NEGATIVE
Glucose, UA: NEGATIVE mg/dL
Hgb urine dipstick: NEGATIVE
Ketones, ur: NEGATIVE mg/dL
Leukocytes,Ua: NEGATIVE
Nitrite: NEGATIVE
Protein, ur: NEGATIVE mg/dL
Specific Gravity, Urine: 1.01 (ref 1.005–1.030)
pH: 6 (ref 5.0–8.0)

## 2021-01-01 LAB — CBC WITH DIFFERENTIAL/PLATELET
Abs Immature Granulocytes: 0.04 10*3/uL (ref 0.00–0.07)
Basophils Absolute: 0.1 10*3/uL (ref 0.0–0.1)
Basophils Relative: 1 %
Eosinophils Absolute: 0.1 10*3/uL (ref 0.0–0.5)
Eosinophils Relative: 2 %
HCT: 31.7 % — ABNORMAL LOW (ref 36.0–46.0)
Hemoglobin: 9.2 g/dL — ABNORMAL LOW (ref 12.0–15.0)
Immature Granulocytes: 1 %
Lymphocytes Relative: 12 %
Lymphs Abs: 0.8 10*3/uL (ref 0.7–4.0)
MCH: 23.7 pg — ABNORMAL LOW (ref 26.0–34.0)
MCHC: 29 g/dL — ABNORMAL LOW (ref 30.0–36.0)
MCV: 81.5 fL (ref 80.0–100.0)
Monocytes Absolute: 0.7 10*3/uL (ref 0.1–1.0)
Monocytes Relative: 10 %
Neutro Abs: 4.9 10*3/uL (ref 1.7–7.7)
Neutrophils Relative %: 74 %
Platelets: 140 10*3/uL — ABNORMAL LOW (ref 150–400)
RBC: 3.89 MIL/uL (ref 3.87–5.11)
RDW: 18.2 % — ABNORMAL HIGH (ref 11.5–15.5)
WBC: 6.6 10*3/uL (ref 4.0–10.5)
nRBC: 0 % (ref 0.0–0.2)

## 2021-01-01 LAB — COMPREHENSIVE METABOLIC PANEL
ALT: 15 U/L (ref 0–44)
AST: 24 U/L (ref 15–41)
Albumin: 3.8 g/dL (ref 3.5–5.0)
Alkaline Phosphatase: 86 U/L (ref 38–126)
Anion gap: 11 (ref 5–15)
BUN: 15 mg/dL (ref 8–23)
CO2: 28 mmol/L (ref 22–32)
Calcium: 9.2 mg/dL (ref 8.9–10.3)
Chloride: 99 mmol/L (ref 98–111)
Creatinine, Ser: 0.71 mg/dL (ref 0.44–1.00)
GFR, Estimated: >60 mL/min (ref >60)
Glucose, Bld: 143 mg/dL — ABNORMAL HIGH (ref 70–99)
Potassium: 3.6 mmol/L (ref 3.5–5.1)
Sodium: 138 mmol/L (ref 135–145)
Total Bilirubin: 0.6 mg/dL (ref 0.3–1.2)
Total Protein: 7.6 g/dL (ref 6.5–8.1)

## 2021-01-01 LAB — LIPASE, BLOOD: Lipase: 26 U/L (ref 11–51)

## 2021-01-01 MED ORDER — ONDANSETRON 4 MG PO TBDP
4.0000 mg | ORAL_TABLET | Freq: Once | ORAL | Status: AC
  Administered 2021-01-01: 4 mg via ORAL
  Filled 2021-01-01: qty 1

## 2021-01-01 MED ORDER — IOHEXOL 300 MG/ML  SOLN
100.0000 mL | Freq: Once | INTRAMUSCULAR | Status: AC
  Administered 2021-01-01: 100 mL via INTRAVENOUS

## 2021-01-01 MED ORDER — ALBUTEROL SULFATE HFA 108 (90 BASE) MCG/ACT IN AERS: 2.0000 | INHALATION_SPRAY | RESPIRATORY_TRACT | Status: DC | PRN

## 2021-01-01 MED ORDER — ONDANSETRON HCL 4 MG/2ML IJ SOLN
4.0000 mg | Freq: Once | INTRAMUSCULAR | Status: AC
  Administered 2021-01-01: 4 mg via INTRAVENOUS
  Filled 2021-01-01: qty 2

## 2021-01-01 MED ORDER — FENTANYL CITRATE (PF) 100 MCG/2ML IJ SOLN
50.0000 ug | Freq: Once | INTRAMUSCULAR | Status: AC
  Administered 2021-01-01: 50 ug via INTRAVENOUS
  Filled 2021-01-01: qty 2

## 2021-01-01 NOTE — ED Provider Notes (Signed)
[facility] Provider Note   CSN: [id number] Arrival date & time: 01/01/21  1205     History Chief Complaint  Patient presents with  . Cough    Haley Henderson is a 65 y.o. female.  65 year old female with history of diabetes, COPD, chronic pancreatitis presents with complaint of feeling unwell today.  Patient states that several weeks ago she developed chills and felt like she could get warm.  Reports she was also having postnasal drip which made her feel nauseous and vomited times.  Patient developed a cough, went to her PCP, had negative Covid and flu testing.  Patient states the cough has resolved however since last night, she is having abdominal bloating and distention, feels very uncomfortable.  Reports normal bowel and bladder habits.  Patient states that she had sweats last night and had to change her sheets 3 times.  No known sick contacts.  Denies shortness of breath, chest pain, fevers.  No other complaints or concerns.        Past Medical History:  Diagnosis Date  . Asthma   . Cancer ([place])   . COPD (chronic obstructive pulmonary disease) ([place])   . Diabetes mellitus without complication ([place])   . Hypertension   . Pancreatitis     Patient Active Problem List   Diagnosis Date Noted  . ABDOMINAL PAIN, PERIUMBILIC 45/36/4680    Past Surgical History:  Procedure Laterality Date  . ABDOMINAL HYSTERECTOMY    . APPENDECTOMY    . CHOLECYSTECTOMY    . FOOT SURGERY    . HERNIA REPAIR    . LUNG LOBECTOMY    . TUBAL LIGATION       OB History   No obstetric history on file.     No family history on file.  Social History   Tobacco Use  . Smoking status: Former Research scientist (life sciences)  . Smokeless tobacco: Never Used  Vaping Use  . Vaping Use: Never used  Substance Use Topics  . Alcohol use: No  . Drug use: No    Home Medications Prior to Admission medications   Medication Sig Start Date End Date Taking? Authorizing Provider   Esomeprazole Magnesium (NEXIUM PO) Take by mouth.    [provider]  Insulin Zinc Human (NOVOLIN L [place]) Inject into the skin.    [provider]  lipase/protease/amylase (CREON) 12000 UNITS CPEP capsule Take by mouth.    [provider]  LOSARTAN POTASSIUM PO Take by mouth.    [provider]  METOPROLOL TARTRATE PO Take by mouth.    [provider]    Allergies    Losartan potassium, Other, Buprenorphine hcl, Morphine, Morphine and related, Ace inhibitors, Gabapentin, Metformin, and Trandolapril-verapamil hcl er  Review of Systems   Review of Systems  Constitutional: Positive for chills and diaphoresis. Negative for fever.  HENT: Positive for postnasal drip. Negative for congestion, sinus pressure and sinus pain.   Respiratory: Negative for cough and shortness of breath.   Cardiovascular: Negative for chest pain.  Gastrointestinal: Positive for abdominal distention, abdominal pain, nausea and vomiting. Negative for blood in stool, constipation and diarrhea.  Genitourinary: Negative for difficulty urinating, dysuria and frequency.  Musculoskeletal: Negative for arthralgias and myalgias.  Skin: Negative for rash and wound.  Allergic/Immunologic: Positive for immunocompromised state.  Neurological: Positive for weakness.  Hematological: Negative for adenopathy.  Psychiatric/Behavioral: Negative for confusion.  All other systems reviewed and are negative.   Physical Exam Updated Vital Signs BP (!) 141/71  Pulse 70   Temp 98.5 F (36.9 C) (Oral)   Resp (!) 21   Ht 5\' 3"  (1.6 m)   Wt 70.8 kg   SpO2 99%   BMI 27.63 kg/m   Physical Exam Vitals and nursing note reviewed.  Constitutional:      General: She is not in acute distress.    Appearance: She is well-developed and well-nourished. She is not diaphoretic.  HENT:     Head: Normocephalic and atraumatic.     Mouth/Throat:     Mouth: Mucous membranes are moist.  Eyes:      Conjunctiva/sclera: Conjunctivae normal.  Cardiovascular:     Rate and Rhythm: Normal rate and regular rhythm.     Pulses: Normal pulses.     Heart sounds: Normal heart sounds.  Pulmonary:     Effort: Pulmonary effort is normal.     Breath sounds: Normal breath sounds.  Abdominal:     General: There is distension.     Palpations: Abdomen is soft.     Tenderness: There is abdominal tenderness in the right upper quadrant and periumbilical area. There is no right CVA tenderness or left CVA tenderness.  Musculoskeletal:     Cervical back: Neck supple.     Right lower leg: No edema.     Left lower leg: No edema.  Skin:    General: Skin is warm and dry.     Findings: No erythema or rash.  Neurological:     Mental Status: She is alert and oriented to person, place, and time.  Psychiatric:        Mood and Affect: Mood and affect normal.        Behavior: Behavior normal.     ED Results / Procedures / Treatments   Labs (all labs ordered are listed, but only abnormal results are displayed) Labs Reviewed  CBC WITH DIFFERENTIAL/PLATELET - Abnormal; Notable for the following components:      Result Value   Hemoglobin 9.2 (*)    HCT 31.7 (*)    MCH 23.7 (*)    MCHC 29.0 (*)    RDW 18.2 (*)    Platelets 140 (*)    All other components within normal limits  COMPREHENSIVE METABOLIC PANEL - Abnormal; Notable for the following components:   Glucose, Bld 143 (*)    All other components within normal limits  URINALYSIS, ROUTINE W REFLEX MICROSCOPIC - Abnormal; Notable for the following components:   Color, Urine STRAW (*)    All other components within normal limits  LIPASE, BLOOD    EKG None  Radiology DG Chest 2 View  Result Date: 01/01/2021 CLINICAL DATA:  C/o cough, chills, sweats, fatigue x 3 wks w/negative flu and COVID tests this week. Hx COPD, asthma. Patient has Dexcom but chose to proceed with exam and was given paper to call for replacement EXAM: CHEST - 2 VIEW COMPARISON:   02/15/2020 FINDINGS: Mildly prominent interstitial markings but no confluent infiltrate or overt edema. Heart size and mediastinal contours are within normal limits. No effusion.  No pneumothorax. Visualized bones unremarkable.  Cholecystectomy clips. IMPRESSION: No acute cardiopulmonary disease. Electronically Signed   By: Lucrezia Europe M.D.   On: 01/01/2021 13:30   CT Abdomen Pelvis W Contrast  Result Date: 01/01/2021 CLINICAL DATA:  Distension EXAM: CT ABDOMEN AND PELVIS WITH CONTRAST TECHNIQUE: Multidetector CT imaging of the abdomen and pelvis was performed using the standard protocol following bolus administration of intravenous contrast. CONTRAST:  125mL OMNIPAQUE IOHEXOL 300  MG/ML  SOLN COMPARISON:  October 22, 2019, January 23, 2020 June 28, 2020. FINDINGS: Lower chest: No acute abnormality.  Periesophageal varices. Hepatobiliary: Nodular contour of the liver. Status post cholecystectomy with unchanged appearance of the common bile duct. 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 Large splenic shunt to the RIGHT hemiabdomen. Pancreas: No new peripancreatic fat stranding. Spleen: Splenomegaly. Adrenals/Urinary Tract: Adrenal glands are unremarkable. Kidneys enhance symmetrically. RIGHT-sided renal cyst. Additional subcentimeter hypodense lesions are too small to accurately characterize. No hydronephrosis. Excreted contrast within the bladder. Stomach/Bowel: No evidence of bowel obstruction. Large duodenal diverticula. There is mild bowel wall thickening at the hepatic flexure extending into the transverse colon and continued to the level of the rectum. Scattered diverticulosis without evidence of diverticulitis. Vascular/Lymphatic: Severe atherosclerotic calcifications of the aorta. Scattered prominent retroperitoneal lymph nodes, unchanged. Reproductive: Status post hysterectomy. Other: Moderate ascites. Small volume of high density  material along the splenic fossa ascites is favored to be due to venous collaterals at this location. Musculoskeletal: LEFT-sided assimilation joint at L5-S1. Sclerosis of the femoral heads could reflect underlying avascular necrosis. IMPRESSION: 1. Mild bowel wall thickening at the hepatic flexure extending into the transverse colon and continuing to the level of the rectum. Findings may reflect infectious or inflammatory colitis. 2. Moderate ascites. Small volume of high density material along the splenic fossa ascites is favored to be due to venous collaterals at this location. 3. 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 4. Sclerosis of the femoral heads could reflect underlying avascular necrosis. Aortic Atherosclerosis (ICD10-I70.0). Electronically Signed   By: Valentino Saxon MD   On: 01/01/2021 16:38    Procedures Procedures   Medications Ordered in ED Medications  ondansetron (ZOFRAN-ODT) disintegrating tablet 4 mg (has no administration in time range)  ondansetron (ZOFRAN) injection 4 mg (4 mg Intravenous Given 01/01/21 1508)  iohexol (OMNIPAQUE) 300 MG/ML solution 100 mL (100 mLs Intravenous Contrast Given 01/01/21 1549)  fentaNYL (SUBLIMAZE) injection 50 mcg (50 mcg Intravenous Given 01/01/21 1632)  ondansetron (ZOFRAN-ODT) disintegrating tablet 4 mg (4 mg Oral Given 01/01/21 1720)    ED Course  I have reviewed the triage vital signs and the nursing notes.  Pertinent labs & imaging results that were available during my care of the patient were reviewed by me and considered in my medical decision making (see chart for details).  Clinical Course as of 01/01/21 1940  Sat Jan 01, 7050  7763 65 year old female presents with complaint of chills, sweats, abdominal distention causing her to feel short of breath.  On exam, patient has vague abdominal discomfort without significant distention at this time.  No lower extremity edema. Chest x-ray is unremarkable  specifically, no pneumonia to attribute her chills or shortness of breath 2. CT abdomen pelvis shows mild bowel wall thickening, possibly related to infectious versus inflammatory colitis.  Patient has a normal white blood cell count, neutrophils normal, no diarrhea or loose stools at this time.  Patient states that she is up-to-date with her colonoscopies.  CT also shows moderate ascites and her known cirrhosis.  Patient's shortness of breath and distention may be related to her ascites, discussed with Dr. Roslynn Amble, ER attending, plan is for patient to follow-up with her GI provider to discuss IR drain.  Review of labs, hemoglobin is 9.2 with hematocrit of 31.7, there are no recent labs available on the [facility] side of epic however when review of care everywhere, these labs are consistent  with patient's last several lab draws.  CMP without significant findings, lipase within normal's.  [LM]    Clinical Course User Index [LM] Roque Lias   MDM Rules/Calculators/A&P                         Final Clinical Impression(s) / ED Diagnoses Final diagnoses:  Other ascites    Rx / DC Orders ED Discharge Orders    None       Roque Lias 01/01/21 1940    Lucrezia Starch, MD 01/04/21 [phone]

## 2021-01-01 NOTE — Discharge Instructions (Addendum)
Follow up with your GI doctor to discuss draining fluid from your abdomen to help with your distention and discomfort.

## 2021-01-01 NOTE — ED Triage Notes (Addendum)
Intermittent cough and chills x 1 week with runny nose. Also c/o abdominal bloating and nausea. States she had neg flu and covid tests this week

## 2021-01-01 NOTE — ED Notes (Signed)
Amb to BR

## 2021-01-01 NOTE — ED Notes (Signed)
Patient transported to CT 

## 2021-04-12 DIAGNOSIS — G4730 Sleep apnea, unspecified: Secondary | ICD-10-CM | POA: Insufficient documentation

## 2021-04-12 DIAGNOSIS — E7800 Pure hypercholesterolemia, unspecified: Secondary | ICD-10-CM | POA: Insufficient documentation

## 2021-05-26 ENCOUNTER — Other Ambulatory Visit: Payer: Self-pay

## 2021-05-26 DIAGNOSIS — R1310 Dysphagia, unspecified: Secondary | ICD-10-CM | POA: Insufficient documentation

## 2021-05-26 DIAGNOSIS — M1990 Unspecified osteoarthritis, unspecified site: Secondary | ICD-10-CM | POA: Insufficient documentation

## 2021-05-26 DIAGNOSIS — I6529 Occlusion and stenosis of unspecified carotid artery: Secondary | ICD-10-CM

## 2021-06-01 ENCOUNTER — Other Ambulatory Visit: Payer: Self-pay

## 2021-06-01 ENCOUNTER — Encounter: Payer: Self-pay | Admitting: Vascular Surgery

## 2021-06-01 ENCOUNTER — Ambulatory Visit (INDEPENDENT_AMBULATORY_CARE_PROVIDER_SITE_OTHER): Payer: Medicare Other | Admitting: Vascular Surgery

## 2021-06-01 ENCOUNTER — Ambulatory Visit (HOSPITAL_COMMUNITY)
Admission: RE | Admit: 2021-06-01 | Discharge: 2021-06-01 | Disposition: A | Discharge status: Home / Self Care | Payer: Medicare Other | Source: Ambulatory Visit | Attending: Vascular Surgery | Admitting: Vascular Surgery

## 2021-06-01 VITALS — BP 128/74 | HR 72 | Temp 98.0°F | Resp 16 | Ht 63.0 in | Wt 139.0 lb

## 2021-06-01 DIAGNOSIS — I83813 Varicose veins of bilateral lower extremities with pain: Secondary | ICD-10-CM | POA: Diagnosis not present

## 2021-06-01 DIAGNOSIS — I6529 Occlusion and stenosis of unspecified carotid artery: Secondary | ICD-10-CM | POA: Diagnosis present

## 2021-06-01 NOTE — Progress Notes (Signed)
ASSESSMENT & PLAN   CAROTID DISEASE: This patient had an ultrasound done elsewhere that reportedly showed a 50% carotid stenosis bilaterally.  However, on our duplex scan today I do not see any evidence of significant carotid stenosis on either side.  Likewise the vertebral arteries are patent with antegrade flow.  She is asymptomatic.  She is on aspirin and is on Repatha.  She is not a smoker.  I do not think routine follow-up carotid duplex scans are indicated unless she develops any new neurologic symptoms.  I be happy to see her back at any time if any new vascular issues arise.  REASON FOR CONSULT:    Bilateral carotid stenosis.  Referred by Eldridge Abrahams, FNP  HPI:   Haley Henderson is a 65 y.o. female who tells me that she had a carotid duplex scan done in [facility] which suggested a 50% carotid stenosis bilaterally.  For this reason she was sent for vascular consultation.  She is left-handed.  She denies any previous history of stroke, TIAs, expressive or receptive aphasia, or amaurosis fugax.  She is on aspirin and is on Repatha.  She is not a smoker.  Her risk factors for vascular disease include diabetes, hypertension, and hypercholesterolemia.  She denies any family history of premature cardiovascular disease.  She is not a smoker.  She denies any history of claudication or rest pain.  She is had no previous history of DVT.  Past Medical History:  Diagnosis Date   Asthma    Cancer ([place])    COPD (chronic obstructive pulmonary disease) ([place])    Diabetes mellitus without complication ([place])    Hypertension    Pancreatitis     History reviewed. No pertinent family history.  SOCIAL HISTORY: Social History   Tobacco Use   Smoking status: Former   Smokeless tobacco: Never  Substance Use Topics   Alcohol use: No    Allergies  Allergen Reactions   Losartan Potassium Hives   Other Other (See Comments)    Contraindication due to liver disease Contraindication due  to liver disease    Buprenorphine Hcl Other (See Comments)    HEADACHES HEADACHES    Morphine Other (See Comments)    headaches headaches    Morphine And Related Other (See Comments)    HA   Ace Inhibitors Other (See Comments)    Cough cough    Gabapentin Other (See Comments)   Metformin Other (See Comments)    Hx of pancreatitis Hx of pancreatitis    Trandolapril-Verapamil Hcl Er Cough and Other (See Comments)     cough     Current Outpatient Medications  Medication Sig Dispense Refill   amitriptyline (ELAVIL) 25 MG tablet Take by mouth.     aspirin 81 MG EC tablet Take by mouth.     Continuous Blood Gluc Sensor (DEXCOM G6 SENSOR) MISC 1 each by Miscellaneous route every 10 days.     Evolocumab (REPATHA SURECLICK) 401 MG/ML SOAJ INJECT 140 MG SUBCUTANEOUSLY ONCE EVERY 14 DAYS     furosemide (LASIX) 40 MG tablet Take 60 mg by mouth daily.     hydrOXYzine (ATARAX/VISTARIL) 10 MG tablet Take 10 mg by mouth every 8 (eight) hours as needed.     Insulin Disposable Pump (OMNIPOD DASH PODS, GEN 4,) MISC Apply topically.     insulin lispro (HUMALOG) 100 UNIT/ML injection Inject into the skin as directed.     Insulin Zinc Human (NOVOLIN L [place]) Inject into the skin.  LORazepam (ATIVAN) 0.5 MG tablet SMARTSIG:1 Tablet(s) By Mouth     METOPROLOL TARTRATE PO Take by mouth.     Multiple Vitamin (ONE DAILY) tablet Take 1 tablet by mouth daily.     oxyCODONE (OXY IR/ROXICODONE) 5 MG immediate release tablet Take 10 mg by mouth 2 (two) times daily as needed.     sertraline (ZOLOFT) 50 MG tablet Take by mouth.     spironolactone (ALDACTONE) 50 MG tablet Take 50 mg by mouth daily.     TRELEGY ELLIPTA 100-62.5-25 MCG/INH AEPB Inhale 1 puff into the lungs daily.     No current facility-administered medications for this visit.    REVIEW OF SYSTEMS:  [X]  denotes positive finding, [ ]  denotes negative finding Cardiac  Comments:  Chest pain or chest pressure:    Shortness of breath upon  exertion: x   Short of breath when lying flat:    Irregular heart rhythm:        Vascular    Pain in calf, thigh, or hip brought on by ambulation:    Pain in feet at night that wakes you up from your sleep:     Blood clot in your veins:    Leg swelling:         Pulmonary    Oxygen at home:    Productive cough:     Wheezing:         Neurologic    Sudden weakness in arms or legs:     Sudden numbness in arms or legs:     Sudden onset of difficulty speaking or slurred speech:    Temporary loss of vision in one eye:     Problems with dizziness:         Gastrointestinal    Blood in stool:     Vomited blood:         Genitourinary    Burning when urinating:     Blood in urine:        Psychiatric    Major depression:         Hematologic    Bleeding problems:    Problems with blood clotting too easily:        Skin    Rashes or ulcers:        Constitutional    Fever or chills:    -  PHYSICAL EXAM:   Vitals:   06/01/21 1414 06/01/21 1416  BP: 127/76 128/74  Pulse: 72   Resp: 16   Temp: 98 F (36.7 C)   SpO2: 93%   Weight: 139 lb (63 kg)   Height: 5\' 3"  (1.6 m)    Body mass index is 24.62 kg/m. GENERAL: The patient is a well-nourished female, in no acute distress. The vital signs are documented above. CARDIAC: There is a regular rate and rhythm.  VASCULAR: I do not detect carotid bruits. She has palpable femoral, popliteal, and dorsalis pedis pulses bilaterally. PULMONARY: There is good air exchange bilaterally without wheezing or rales. ABDOMEN: Soft and non-tender with normal pitched bowel sounds.  I do not palpate an abdominal aortic aneurysm. MUSCULOSKELETAL: There are no major deformities. NEUROLOGIC: No focal weakness or paresthesias are detected. SKIN: There are no ulcers or rashes noted. PSYCHIATRIC: The patient has a normal affect.  DATA:    CAROTID DUPLEX: I have independently interpreted her carotid duplex scan today.  On the right side there is  no significant stenosis.  The right vertebral artery is patent with antegrade flow.  On  the left side there is no significant carotid stenosis.  The left vertebral artery is patent with antegrade flow.  What she is seen okay Velocities  HTN XL okay alright thank okay if it already  Deitra [facility]

## 2022-07-06 ENCOUNTER — Ambulatory Visit (INDEPENDENT_AMBULATORY_CARE_PROVIDER_SITE_OTHER): Payer: Medicare Other | Admitting: Podiatry

## 2022-07-06 DIAGNOSIS — Z91199 Patient's noncompliance with other medical treatment and regimen due to unspecified reason: Secondary | ICD-10-CM

## 2022-07-06 NOTE — Progress Notes (Signed)
No show

## 2022-07-14 ENCOUNTER — Ambulatory Visit (INDEPENDENT_AMBULATORY_CARE_PROVIDER_SITE_OTHER): Payer: Medicare Other | Admitting: Podiatry

## 2022-07-14 ENCOUNTER — Encounter: Payer: Self-pay | Admitting: Podiatry

## 2022-07-14 DIAGNOSIS — E1169 Type 2 diabetes mellitus with other specified complication: Secondary | ICD-10-CM

## 2022-07-14 DIAGNOSIS — M79675 Pain in left toe(s): Secondary | ICD-10-CM | POA: Diagnosis not present

## 2022-07-14 DIAGNOSIS — M79674 Pain in right toe(s): Secondary | ICD-10-CM

## 2022-07-14 DIAGNOSIS — E785 Hyperlipidemia, unspecified: Secondary | ICD-10-CM | POA: Diagnosis not present

## 2022-07-14 DIAGNOSIS — B351 Tinea unguium: Secondary | ICD-10-CM

## 2022-07-14 NOTE — Progress Notes (Signed)
  Subjective:  Patient ID: Haley Henderson, female    DOB: 1956/02/14,   MRN: [id number]  No chief complaint on file.   66 y.o. female presents for concern  for diabetic foot check. Relates she has neuropathy. Recently had her nails trimmed and relates she does not need them trimmed today.  Relates burning and tingling in their feet. Patient is diabetic and last A1c was  7.2  PCP:  Berkley Harvey, NP    . Denies any other pedal complaints. Denies n/v/f/c.   Past Medical History:  Diagnosis Date   Asthma    Cancer ([place])    COPD (chronic obstructive pulmonary disease) ([place])    Diabetes mellitus without complication ([place])    Hypertension    Pancreatitis     Objective:  Physical Exam: Vascular: DP/PT pulses 2/4 bilateral. CFT <3 seconds. Absent hair growth on digits. Edema noted to bilateral lower extremities. Xerosis noted bilaterally.  Skin. No lacerations or abrasions bilateral feet. Nails 1-5 bilateral  are  normal in appearance.  Musculoskeletal: MMT 5/5 bilateral lower extremities in DF, PF, Inversion and Eversion. Deceased ROM in DF of ankle joint.  Neurological: Sensation intact to light touch. Protective sensation diminished bilateral.    Assessment:   1. Pain due to onychomycosis of toenails of both feet   2. Hyperlipidemia associated with type 2 diabetes mellitus ([place])      Plan:  Patient was evaluated and treated and all questions answered. -Discussed and educated patient on diabetic foot care, especially with  regards to the vascular, neurological and musculoskeletal systems.  -Stressed the importance of good glycemic control and the detriment of not  controlling glucose levels in relation to the foot. -Discussed supportive shoes at all times and checking feet regularly.   -Answered all patient questions -Patient to return  in 3 months for rfc.  -Patient advised to call the office if any problems or questions arise in the meantime.   Lorenda Peck, DPM

## 2022-10-12 ENCOUNTER — Ambulatory Visit: Payer: Medicare Other | Admitting: Podiatrist

## 2022-10-13 ENCOUNTER — Ambulatory Visit: Payer: Medicare Other | Admitting: Podiatrist

## 2022-11-08 IMAGING — CT CT ABD-PELV W/ CM
2 of 5 series · 16 of 46 positions shown, 18 images · IV contrast (omnipaque)
Comparison: [DATE] [DATE], [DATE], [DATE] [DATE], [DATE] [DATE] [DATE], [DATE].

CLINICAL DATA: Distension

EXAM:
CT ABDOMEN AND PELVIS WITH CONTRAST
TECHNIQUE: Multidetector CT imaging of the abdomen and pelvis was performed
using the standard protocol following bolus administration of
intravenous contrast.
CONTRAST:  100mL OMNIPAQUE IOHEXOL 300 MG/ML  SOLN

[Series 2: axial st · axial · 0.91mm/px · z∈[+646,+1076]mm · 13 of 96 slices shown, 15 images]
[im 5/96  soft-tissue]
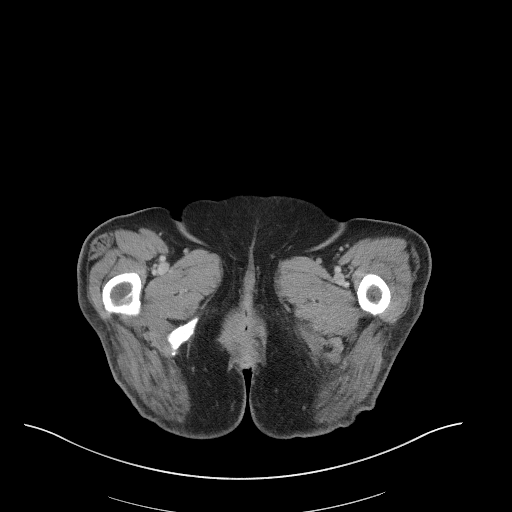
[im 5/96  bone]
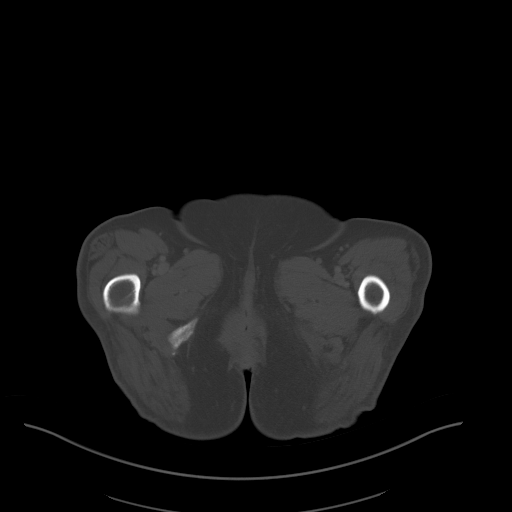
[im 15/96  soft-tissue]
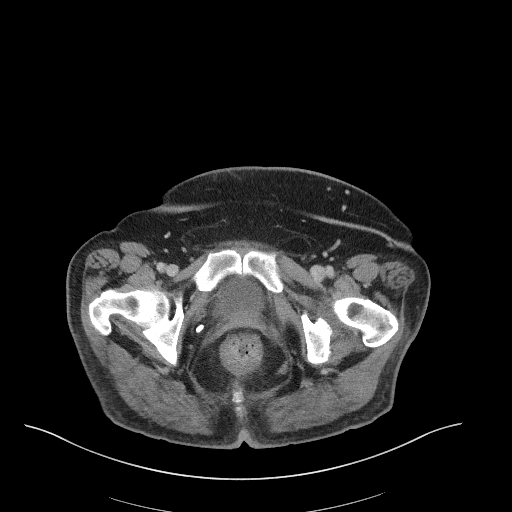
[im 20/96  soft-tissue]
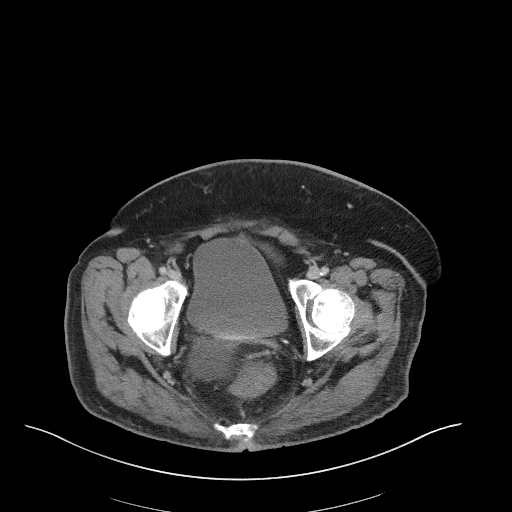
[im 29/96  soft-tissue]
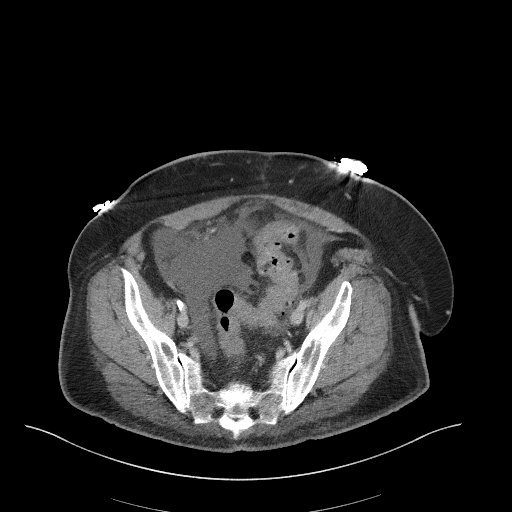
[im 34/96  soft-tissue]
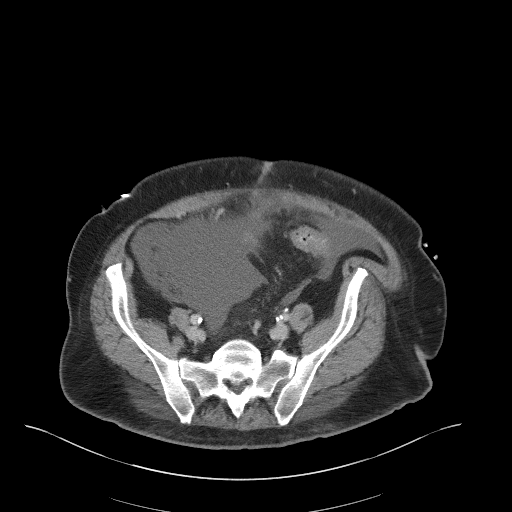
[im 43/96  soft-tissue]
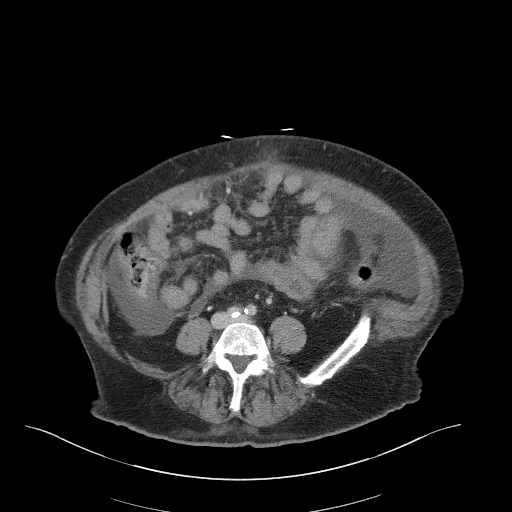
[im 48/96  soft-tissue]
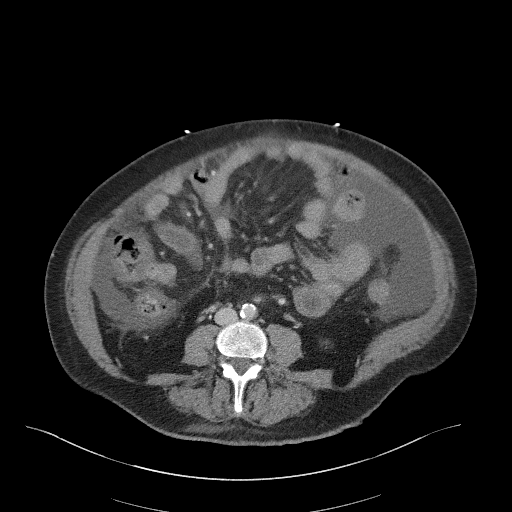
[im 53/96  soft-tissue]
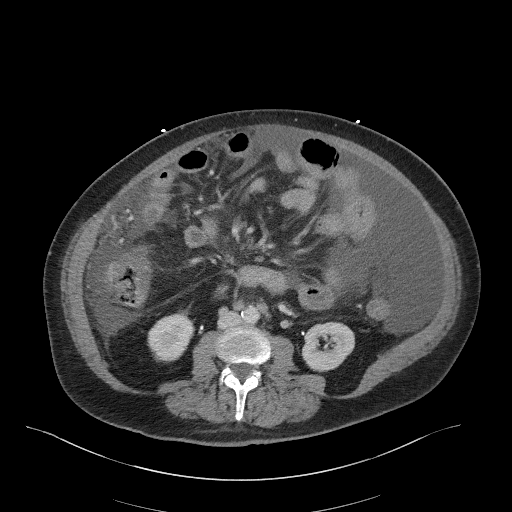
[im 62/96  soft-tissue]
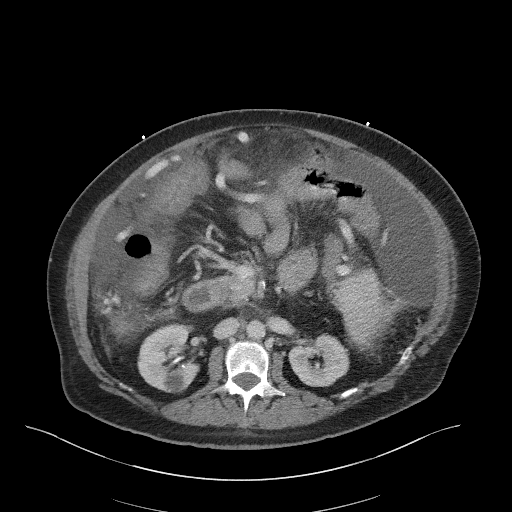
[im 62/96  bone]
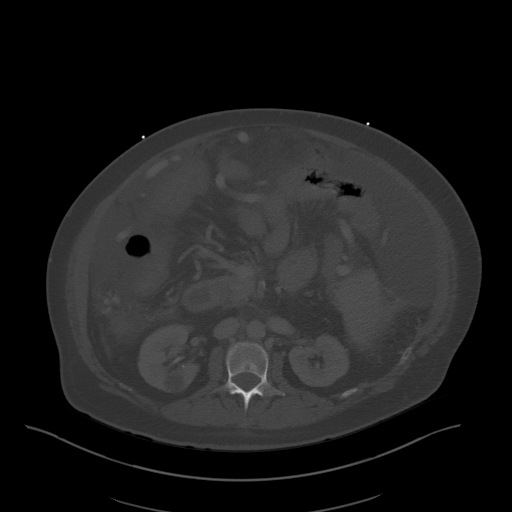
[im 67/96  soft-tissue]
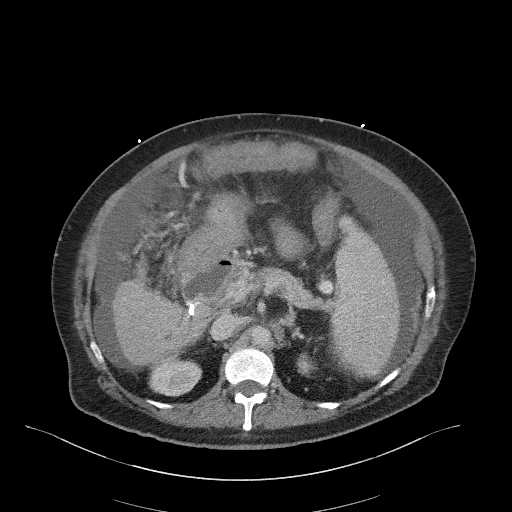
[im 77/96  soft-tissue]
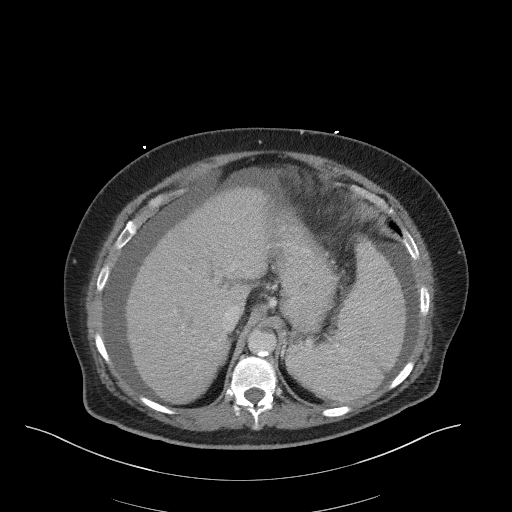
[im 81/96  soft-tissue]
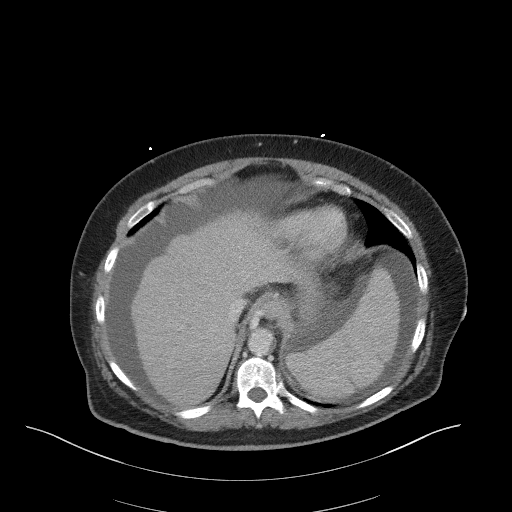
[im 91/96  soft-tissue]
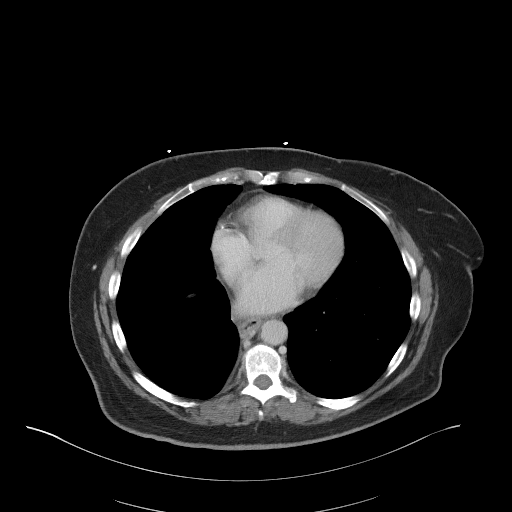

[Series 5: coronal st · coronal · 0.91mm/px · 3 of 74 slices shown]
[im 25/74  soft-tissue]
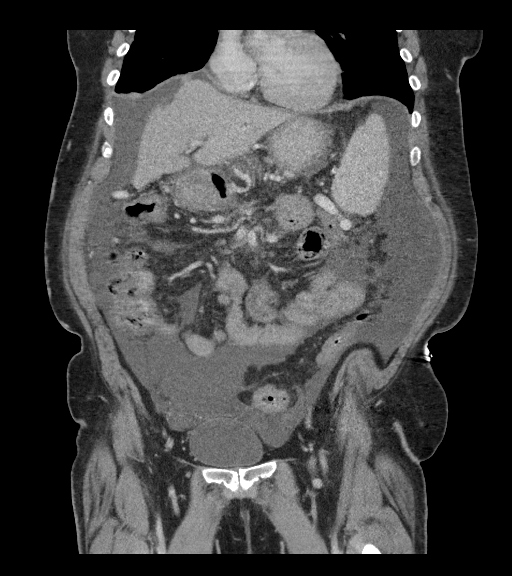
[im 33/74  soft-tissue]
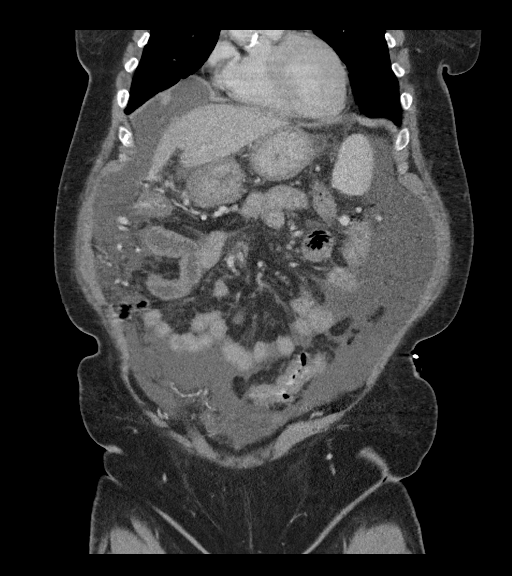
[im 41/74  soft-tissue]
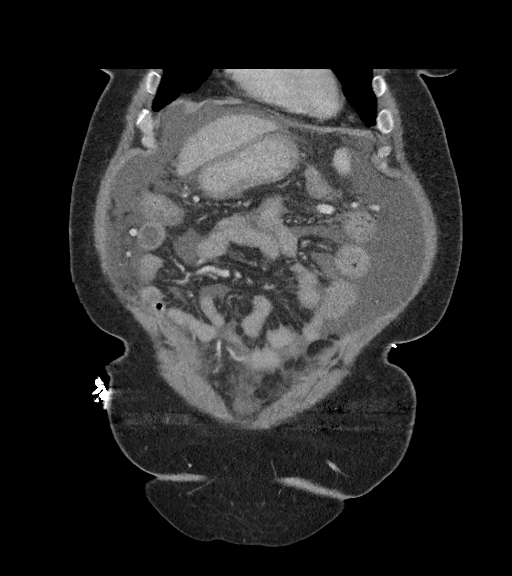

[16 of 46 positions shown; findings below may reference images not displayed]

FINDINGS: Lower chest: No acute abnormality.  Periesophageal varices.

Hepatobiliary: Nodular contour of the liver. Status post
cholecystectomy with unchanged appearance of the common bile duct.
Prominent tangle of vessels the porta hepatis consistent with
collateralization from prior portal vein occlusion. Large splenic
shunt to the RIGHT hemiabdomen.

Pancreas: No new peripancreatic fat stranding.

Spleen: Splenomegaly.

Adrenals/Urinary Tract: Adrenal glands are unremarkable. Kidneys
enhance symmetrically. RIGHT-sided renal cyst. Additional
subcentimeter hypodense lesions are too small to accurately
characterize. No hydronephrosis. Excreted contrast within the
bladder.

Stomach/Bowel: No evidence of bowel obstruction. Large duodenal
diverticula. There is mild bowel wall thickening at the hepatic
flexure extending into the transverse colon and continued to the
level of the rectum. Scattered diverticulosis without evidence of
diverticulitis.

Vascular/Lymphatic: Severe atherosclerotic calcifications of the
aorta. Scattered prominent retroperitoneal lymph nodes, unchanged.

Reproductive: Status post hysterectomy.

Other: Moderate ascites. Small volume of high density material along
the splenic fossa ascites is favored to be due to venous collaterals
at this location.

Musculoskeletal: LEFT-sided assimilation joint at L5-S1. Sclerosis
of the femoral heads could reflect underlying avascular necrosis.
IMPRESSION: 1. Mild bowel wall thickening at the hepatic flexure extending into
the transverse colon and continuing to the level of the rectum.
Findings may reflect infectious or inflammatory colitis.
2. Moderate ascites. Small volume of high density material along the
splenic fossa ascites is favored to be due to venous collaterals at
this location.
3. Cirrhosis with sequela of portal vein occlusion and splenomegaly.
4. Sclerosis of the femoral heads could reflect underlying avascular
necrosis.

Aortic Atherosclerosis ([id number].D).

## 2023-01-28 ENCOUNTER — Other Ambulatory Visit: Payer: Self-pay | Admitting: Physician Assistant

## 2023-01-28 DIAGNOSIS — K7460 Unspecified cirrhosis of liver: Secondary | ICD-10-CM

## 2023-01-28 DIAGNOSIS — K7031 Alcoholic cirrhosis of liver with ascites: Secondary | ICD-10-CM

## 2023-01-30 ENCOUNTER — Other Ambulatory Visit: Payer: Self-pay | Admitting: Interventional Radiology

## 2023-01-30 ENCOUNTER — Ambulatory Visit
Admission: RE | Admit: 2023-01-30 | Discharge: 2023-01-30 | Disposition: A | Discharge status: Home / Self Care | Payer: Medicare Other | Source: Ambulatory Visit | Attending: Physician Assistant | Admitting: Physician Assistant

## 2023-01-30 DIAGNOSIS — K7581 Nonalcoholic steatohepatitis (NASH): Secondary | ICD-10-CM

## 2023-01-30 DIAGNOSIS — K766 Portal hypertension: Secondary | ICD-10-CM

## 2023-01-30 DIAGNOSIS — K7031 Alcoholic cirrhosis of liver with ascites: Secondary | ICD-10-CM

## 2023-01-30 NOTE — Consult Note (Signed)
Chief Complaint: Symptomatic ascites  Referring Physician(s): Izaj / Le (GI)  History of Present Illness: Haley Henderson is a 67 y.o. female with past med history significant for asthma, COPD, lung cancer, diabetes, hypertension, NASH cirrhosis and history of portal vein thrombosis (likely secondary to prior episode of severe pancreatitis seen on remote abdominal CT performed February 2014) who has been referred for TIPS evaluation secondary to development of recurrent symptomatic ascites.  The patient has required more frequent large-volume paracenteses, most recently on 01/26/2023 yielding 6.3 L, previously on 01/18/2023 yielding 6.8 L and on 12/15/2022 yielding 3.3 L despite being on escalating diuretic medications (presently on Lasix 160 mg daily as well as Aldactone 200 mg daily) and adhering to a low-salt diet.  The patient reports abdominal distention but denies frank abdominal pain.  She has irregular bowel habits with constipation and diarrhea though denies bloody or melanotic stools.  She denies yellowing of the skin or eyes.  No change in mental status.  The patient currently lives with friends from church though is independent with all activities of daily living.   Past Medical History:  Diagnosis Date   Asthma    Cancer ([place])    COPD (chronic obstructive pulmonary disease) ([place])    Diabetes mellitus without complication ([place])    Hypertension    Pancreatitis     Past Surgical History:  Procedure Laterality Date   ABDOMINAL HYSTERECTOMY     APPENDECTOMY     CHOLECYSTECTOMY     FOOT SURGERY     HERNIA REPAIR     LUNG LOBECTOMY     TUBAL LIGATION      Allergies: Losartan potassium, Other, Buprenorphine hcl, Morphine, Morphine and related, Ace inhibitors, Gabapentin, Metformin, and Trandolapril-verapamil hcl er  Medications: Prior to Admission medications   Medication Sig Start Date End Date Taking? Authorizing Provider  amitriptyline (ELAVIL) 25 MG tablet  Take by mouth. 05/31/21   [provider]  aspirin 81 MG EC tablet Take by mouth. 12/29/15   [provider]  Evolocumab (REPATHA SURECLICK) [id number] MG/ML SOAJ INJECT 140 MG SUBCUTANEOUSLY ONCE EVERY 14 DAYS 11/09/20   [provider]  furosemide (LASIX) 40 MG tablet Take 60 mg by mouth daily. 05/02/21   [provider]  hydrOXYzine (ATARAX/VISTARIL) 10 MG tablet Take 10 mg by mouth every 8 (eight) hours as needed. 05/27/21   [provider]  Insulin Disposable Pump (OMNIPOD DASH PODS, GEN 4,) MISC Apply topically. 05/06/21   [provider]  insulin lispro (HUMALOG) 100 UNIT/ML injection Inject into the skin as directed. 04/05/21   [provider]  Insulin Zinc Human (NOVOLIN L [place]) Inject into the skin.    [provider]  LORazepam (ATIVAN) 0.5 MG tablet SMARTSIG:1 Tablet(s) By Mouth 05/13/21   [provider]  METOPROLOL TARTRATE PO Take by mouth.    [provider]  Multiple Vitamin (ONE DAILY) tablet Take 1 tablet by mouth daily. 08/20/15   [provider]  oxyCODONE (OXY IR/ROXICODONE) 5 MG immediate release tablet Take 10 mg by mouth 2 (two) times daily as needed. 05/10/21   [provider]  sertraline (ZOLOFT) 50 MG tablet Take by mouth. 05/27/21   [provider]  spironolactone (ALDACTONE) 50 MG tablet Take 50 mg by mouth daily. 04/22/21   [provider]  TRELEGY ELLIPTA 100-62.5-25 MCG/INH AEPB Inhale 1 puff into the lungs daily. 05/08/21   [provider]     No family history on file.  Social History   Socioeconomic History   Marital status: Divorced    Spouse name: Not on file   Number of children: Not on file   Years of education: Not on file   Highest education level: Not on file  Occupational History   Not on file  Tobacco Use   Smoking status: Former   Smokeless tobacco: Never  Vaping Use   Vaping Use: Never used  Substance and Sexual Activity    Alcohol use: No   Drug use: No   Sexual activity: Not on file  Other Topics Concern   Not on file  Social History Narrative   Not on file   Social Determinants of Health   Financial Resource Strain: Not on file  Food Insecurity: Not on file  Transportation Needs: Not on file  Physical Activity: Not on file  Stress: Not on file  Social Connections: Not on file    ECOG Status: 1 - Symptomatic but completely ambulatory  Review of Systems  Review of Systems: A 12 point ROS discussed and pertinent positives are indicated in the HPI above.  All other systems are negative.  Physical Exam No direct physical exam was performed (except for noted visual exam findings with Video Visits).    Vital Signs: There were no vitals taken for this visit.  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  The right portal vein does demonstrate relatively preserved intraparenchymal plaque.  There is made of several hypertrophied distal esophageal varices.  There is no significantly hypertrophied splenorenal shunt.  The etiology of her now chronic portal thrombosis is favored to be secondary to severe episode of pancreatitis as demonstrated on remote abdominal CT performed 12/03/2012  Ultrasound paracentesis: 01/26/2023 - 6.3 L 01/18/2023 - 6.8 L 12/15/2022 - 3.3 L 12/06/2022 - 6.2 L 05/17/2021 - 2.25 L  Labs:  CBC: No results for input(s): "WBC", "HGB", "HCT", "PLT" in the last 8760 hours.  Calculated NaMELD score based on laboratory values obtained on 01/17/2023:  Sodium - 139 Creatinine - 0.8 Bilirubin - 0.7 INR - 1.0 Na MELD - 7  TUMOR MARKERS: AFP - 3.4  Assessment and Plan:  [name] is a 67 y.o. female with past med history significant for asthma, COPD, lung cancer, diabetes,  hypertension, NASH cirrhosis and history of portal vein thrombosis (likely secondary to prior episode of severe pancreatitis seen on remote abdominal CT performed February 2014) who has been referred for TIPS evaluation secondary to development of recurrent symptomatic ascites.  Patient has required more frequent and large-volume paracenteses as follows: 01/26/2023 - 6.3 L 01/18/2023 - 6.8 L 12/15/2022 - 3.3 L 12/06/2022 - 6.2 L 05/17/2021 - 2.25 L  The patient most recently underwent an upper endoscopy on 12/26/2022 which demonstrated multiple small linear ulcers within the stomach but demonstrated normal appearance of the esophagus.  Prior endoscopy performed on 09/19/2022 demonstrated small varices within the lower third of the esophagus as well as some mild portal hypertensive gastropathy within the body of the stomach.  Calculated sodium MELD score based on laboratory values obtained on 01/17/2023 is within acceptable limits at 7.  AFP level obtained on 01/17/2023 was normal at 3.4.  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  The right portal vein does demonstrate relatively preserved intraparenchymal course.  Note is made of several hypertrophied distal esophageal varices.  There is no significantly hypertrophied splenorenal shunt.  The etiology of her now chronic portal thrombosis is favored to be secondary to severe episode of pancreatitis as demonstrated on remote abdominal CT performed 12/03/2012  Given her history of chronic portal vein occlusion, I am uncertain the patient will ultimately be a candidate for TIPS creation (as the portal vein serves as the landing zone of the stent between the hepatic vein), however I will proceed with obtaining an up-to-date BRTO protocol CT scan abdomen pelvis to determine both  her anatomy as well as to screen for the development of hepatocellular carcinoma (though the patient's AFP level remains within normal limits).  I explained that ultimately depending on her anatomy she may not be a candidate for TIPS creation but the patient is still interested in pursuing the initial workup.  Plan: - Follow-up telemedicine consultation following acquisition of BRTO protocol CT scan of the abdomen and pelvis to evaluate the current state of the portal venous system (the CT may be obtained at [facility]).   Thank you for this interesting consult.  I greatly enjoyed meeting Lilykate Banville and look forward to participating in their care.  A copy of this report was sent to the requesting provider on this date.  Electronically Signed: Sandi Mariscal 01/30/2023, 11:58 AM   I spent a total of 30 Minutes in remote  clinical consultation, greater than 50% of which was counseling/coordinating care for TIPS evaluation.    Visit type: Audio only (telephone). Audio (no video) only due to patient's lack of internet/smartphone capability. Alternative for in-person consultation at [facility], [address], [place], [place]. This visit type was conducted due to national recommendations for restrictions regarding the COVID-19 Pandemic (e.g. social distancing).  This format is felt to be most appropriate for this patient at this time.  All issues noted in this document were discussed and addressed.

## 2023-03-01 ENCOUNTER — Ambulatory Visit
Admission: RE | Admit: 2023-03-01 | Discharge: 2023-03-01 | Disposition: A | Discharge status: Home / Self Care | Payer: 59 | Source: Ambulatory Visit | Attending: Interventional Radiology | Admitting: Interventional Radiology

## 2023-03-01 DIAGNOSIS — K7581 Nonalcoholic steatohepatitis (NASH): Secondary | ICD-10-CM

## 2023-03-01 DIAGNOSIS — K766 Portal hypertension: Secondary | ICD-10-CM

## 2023-03-01 NOTE — Progress Notes (Signed)
Patient ID: Haley Henderson, female   DOB: [birth year], 67 y.o.   MRN: [id number]        Chief Complaint: Symptomatic ascites   Referring Physician(s): Izaj / Le (GI)   History of Present Illness: Haley Henderson is a 67 y.o. female with past med history significant for asthma, COPD, lung cancer, diabetes, hypertension, NASH cirrhosis and history of portal vein thrombosis (likely secondary to prior episode of severe pancreatitis seen on remote abdominal CT performed February 2014) who is seen today in telemedicine consultation following acquisition of BRTO protocol CT scan performed 02/13/2023, obtained for procedural planning purposes prior to potential elective TIPS creation.  In review, the patient has required more frequent large-volume paracenteses, most recently on 02/13/2023 yielding 5.75 L of peritoneal fluid despite being on escalating diuretic medications (presently on Lasix 160 mg daily as well as Aldactone 200 mg daily) and adhering to a low-salt diet.   The patient reports abdominal distention but denies frank abdominal pain.  She has irregular bowel habits with constipation and diarrhea though denies bloody or melanotic stools.  She denies yellowing of the skin or eyes.  No change in mental status.   The patient currently lives with friends from church though is independent with all activities of daily living.   Past Medical History:  Diagnosis Date   Asthma    Cancer ([facility])    COPD (chronic obstructive pulmonary disease) ([facility])    Diabetes mellitus without complication ([facility])    Hypertension    Pancreatitis     Past Surgical History:  Procedure Laterality Date   ABDOMINAL HYSTERECTOMY     APPENDECTOMY     CHOLECYSTECTOMY     FOOT SURGERY     HERNIA REPAIR     LUNG LOBECTOMY     TUBAL LIGATION      Allergies: Losartan potassium, Other, Buprenorphine hcl, Morphine, Morphine and related, Ace inhibitors, Gabapentin, Metformin, and Trandolapril-verapamil hcl  er  Medications: Prior to Admission medications   Medication Sig Start Date End Date Taking? Authorizing Provider  amitriptyline (ELAVIL) 25 MG tablet Take by mouth. 05/31/21   [provider]  aspirin 81 MG EC tablet Take by mouth. 12/29/15   [provider]  Evolocumab (REPATHA SURECLICK) 140 MG/ML SOAJ INJECT 140 MG SUBCUTANEOUSLY ONCE EVERY 14 DAYS 11/09/20   [provider]  furosemide (LASIX) 40 MG tablet Take 60 mg by mouth daily. 05/02/21   [provider]  hydrOXYzine (ATARAX/VISTARIL) 10 MG tablet Take 10 mg by mouth every 8 (eight) hours as needed. 05/27/21   [provider]  Insulin Disposable Pump (OMNIPOD DASH PODS, GEN 4,) MISC Apply topically. 05/06/21   [provider]  insulin lispro (HUMALOG) 100 UNIT/ML injection Inject into the skin as directed. 04/05/21   [provider]  Insulin Zinc Human (NOVOLIN L [place]) Inject into the skin.    [provider]  LORazepam (ATIVAN) 0.5 MG tablet SMARTSIG:1 Tablet(s) By Mouth 05/13/21   [provider]  METOPROLOL TARTRATE PO Take by mouth.    [provider]  Multiple Vitamin (ONE DAILY) tablet Take 1 tablet by mouth daily. 08/20/15   [provider]  oxyCODONE (OXY IR/ROXICODONE) 5 MG immediate release tablet Take 10 mg by mouth 2 (two) times daily as needed. 05/10/21   [provider]  sertraline (ZOLOFT) 50 MG tablet Take by mouth. 05/27/21   [provider]  spironolactone (ALDACTONE) 50 MG tablet Take 50 mg by mouth daily. 04/22/21  [provider]  TRELEGY ELLIPTA 100-62.5-25 MCG/INH AEPB Inhale 1 puff into the lungs daily. 05/08/21   [provider]     No family history on file.  Social History   Socioeconomic History   Marital status: Divorced    Spouse name: Not on file   Number of children: Not on file   Years of education: Not on file   Highest education level: Not on file  Occupational History   Not  on file  Tobacco Use   Smoking status: Former   Smokeless tobacco: Never  Vaping Use   Vaping Use: Never used  Substance and Sexual Activity   Alcohol use: No   Drug use: No   Sexual activity: Not on file  Other Topics Concern   Not on file  Social History Narrative   Not on file   Social Determinants of Health   Financial Resource Strain: Not on file  Food Insecurity: Not on file  Transportation Needs: Not on file  Physical Activity: Not on file  Stress: Not on file  Social Connections: Not on file    ECOG Status: 2 - Symptomatic, <50% confined to bed  Review of Systems  Review of Systems: A 12 point ROS discussed and pertinent positives are indicated in the HPI above.  All other systems are negative.  Physical Exam No direct physical exam was performed (except for noted visual exam findings with Video Visits).   Vital Signs: There were no vitals taken for this visit.  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  There is relatively preserved patency of the intrahepatic segments of both the right and left portal veins, as well as the distal tributaries of the SMV to the level of the portal splenule confluence.    Re-demonstrated hypertrophied distal esophageal varices as well as development of several hypertrophied venous collaterals within the right upper abdominal quadrant.  Splenomegaly.  Trace amount of intra-abdominal ascites following preceding large-volume paracentesis.  No discrete enhancing lesions are identified to suggest the presence of hepatocellular carcinoma.   Labs:  CBC: No results for input(s): "WBC", "HGB", "HCT", "PLT" in the last 8760 hours.  COAGS: No results for input(s): "INR", "APTT" in the last 8760 hours.  BMP: No results for input(s): "NA", "K", "CL", "CO2", "GLUCOSE", "BUN", "CALCIUM",  "CREATININE", "GFRNONAA", "GFRAA" in the last 8760 hours.  Invalid input(s): "CMP"  LIVER FUNCTION TESTS: No results for input(s): "BILITOT", "AST", "ALT", "ALKPHOS", "PROT", "ALBUMIN" in the last 8760 hours.  TUMOR MARKERS: No results for input(s): "AFPTM", "CEA", "CA199", "CHROMGRNA" in the last 8760 hours.  Assessment and Plan:  Haley Henderson is a 67 y.o. female with past med history significant for asthma, COPD, lung cancer, diabetes, hypertension, NASH cirrhosis and history of portal vein thrombosis (likely secondary to prior episode of severe pancreatitis seen on remote abdominal CT performed February 2014) who is seen today in telemedicine consultation following acquisition of BRTO protocol CT scan performed 02/13/2023, obtained for procedural planning purposes prior to potential elective TIPS creation.  In review, the patient has required more frequent large-volume paracenteses, most recently on 02/13/2023 yielding 5.75 L of peritoneal fluid despite being on escalating diuretic medications (presently on Lasix 160 mg daily as well as Aldactone 200 mg daily) and adhering to a low-salt diet.  Personal review of BRTO protocol CT scan abdomen pelvis performed 04/15/2023 demonstrates chronic occlusion of  the main portal and splenic veins with cavernous transformation.  There is relatively preserved patency of the intrahepatic segments of both the right and left portal veins, as well as the distal tributaries of the SMV to the level of the portal splenule confluence.  (Again, the etiology of the patient's chronic portal vein thrombosis is likely secondary to a severe episode of pancreatitis demonstrated on remote abdominal CT performed in 2014)  Redemonstrated hypertrophied distal esophageal varices as well as development of several hypertrophied venous collaterals within the right upper abdominal quadrant.  Splenomegaly.  Trace amount of intra-abdominal ascites following preceding large-volume  paracentesis.  No discrete enhancing lesions are identified to suggest the presence of hepatocellular carcinoma. ______________________________________________________________  Calculated NaMELD score is within acceptable limits at 7 (obtained on laboratory values from 01/17/2023)  Above was discussed at great length with the patient.    I explained that it may be technically not possible to create a TIPS given the severity and chronicity of her portal vein thrombosis, however as she has exhausted all medical options and is now requiring weekly/biweekly paracenteses, I am willing to attempt TIPS creation with portal vein recanalization if she remains willing.  I explained that the procedure would be performed at [facility] and might entail an attempted trans-splenic access in addition to standard TIPS creation with ICE guidance.  I explained that the procedure is performed with general anesthesia and requires an overnight admission to the hospital for continued observation and potential PCA usage.  I explained that the greatest intraprocedural risk is likely attributable to risk of bleeding and largest postprocedural risk would be the development of encephalopathy.  Following this prolonged and detailed conversation, the patient understands that her situation is dire and as such wishes to pursue attempted TIPS creation/portal vein re-construction.  Patient states that she has recently undergone an echocardiogram, which is not currently available for my review, however assuming adequate right-sided heart function, will proceed with scheduling the attempted TIPS creation with portal vein reconstruction with general anesthesia at [facility] where I will be assisted by my interventional radiology partner Dr. Elby Showers.  Paracentesis will be performed at the time of the TIPS.   Plan: - Schedule elective TIPS creation with portal vein reconstruction at [facility] with general  anesthesia.  I will be assisted for this procedure by my interventional radiology partner, Dr. Elby Showers, for this highly complex and technical procedure.  A copy of this report was sent to the requesting provider on this date.  Electronically Signed: Simonne Come 03/01/2023, 11:17 AM   I spent a total of 25 Minutes in remote  clinical consultation, greater than 50% of which was counseling/coordinating care for TIPS creation.    Visit type: Audio only (telephone). Audio (no video) only due to patient's lack of internet/smartphone capability. Alternative for in-person consultation at [facility], [address] [place], [place], [place]. This visit type was conducted due to national recommendations for restrictions regarding the COVID-19 Pandemic (e.g. social distancing).  This format is felt to be most appropriate for this patient at this time.  All issues noted in this document were discussed and addressed.

## 2023-03-06 ENCOUNTER — Other Ambulatory Visit (HOSPITAL_COMMUNITY): Payer: Self-pay | Admitting: Interventional Radiology

## 2023-03-06 DIAGNOSIS — K7460 Unspecified cirrhosis of liver: Secondary | ICD-10-CM

## 2023-03-06 DIAGNOSIS — K7469 Other cirrhosis of liver: Secondary | ICD-10-CM

## 2023-03-08 ENCOUNTER — Encounter: Payer: Self-pay | Admitting: Interventional Radiology

## 2023-03-08 DIAGNOSIS — K766 Portal hypertension: Secondary | ICD-10-CM

## 2023-03-08 DIAGNOSIS — K7581 Nonalcoholic steatohepatitis (NASH): Secondary | ICD-10-CM
